# Patient Record
Sex: Female | Born: 1965 | Race: Black or African American | Hispanic: No | Marital: Single | Smoking: Former smoker
Health system: Southern US, Community
[De-identification: ages and names within clinical notes are randomized; demographics above are authoritative.]

## PROBLEM LIST (undated history)

## (undated) DIAGNOSIS — K219 Gastro-esophageal reflux disease without esophagitis: Secondary | ICD-10-CM

## (undated) DIAGNOSIS — M1990 Unspecified osteoarthritis, unspecified site: Secondary | ICD-10-CM

## (undated) DIAGNOSIS — IMO0001 Reserved for inherently not codable concepts without codable children: Secondary | ICD-10-CM

## (undated) DIAGNOSIS — R7301 Impaired fasting glucose: Secondary | ICD-10-CM

## (undated) DIAGNOSIS — Z9889 Other specified postprocedural states: Secondary | ICD-10-CM

## (undated) DIAGNOSIS — I10 Essential (primary) hypertension: Secondary | ICD-10-CM

## (undated) DIAGNOSIS — E669 Obesity, unspecified: Secondary | ICD-10-CM

## (undated) HISTORY — DX: Gastro-esophageal reflux disease without esophagitis: K21.9

## (undated) HISTORY — DX: Impaired fasting glucose: R73.01

## (undated) HISTORY — DX: Other specified postprocedural states: Z98.890

## (undated) HISTORY — DX: Obesity, unspecified: E66.9

## (undated) HISTORY — DX: Essential (primary) hypertension: I10

## (undated) HISTORY — DX: Reserved for inherently not codable concepts without codable children: IMO0001

## (undated) HISTORY — DX: Unspecified osteoarthritis, unspecified site: M19.90

---

## 2010-09-13 ENCOUNTER — Other Ambulatory Visit: Payer: Self-pay | Admitting: Family Medicine

## 2010-09-13 DIAGNOSIS — Z139 Encounter for screening, unspecified: Secondary | ICD-10-CM

## 2010-09-18 ENCOUNTER — Ambulatory Visit (HOSPITAL_COMMUNITY)
Admission: RE | Admit: 2010-09-18 | Discharge: 2010-09-18 | Disposition: A | Discharge status: Home / Self Care | Payer: 59 | Source: Ambulatory Visit | Attending: Family Medicine | Admitting: Family Medicine

## 2010-09-18 DIAGNOSIS — Z139 Encounter for screening, unspecified: Secondary | ICD-10-CM

## 2010-09-18 DIAGNOSIS — Z1231 Encounter for screening mammogram for malignant neoplasm of breast: Secondary | ICD-10-CM | POA: Insufficient documentation

## 2010-09-25 ENCOUNTER — Other Ambulatory Visit: Payer: Self-pay | Admitting: Family Medicine

## 2010-09-25 DIAGNOSIS — R928 Other abnormal and inconclusive findings on diagnostic imaging of breast: Secondary | ICD-10-CM

## 2010-10-03 ENCOUNTER — Ambulatory Visit (HOSPITAL_COMMUNITY)
Admission: RE | Admit: 2010-10-03 | Discharge: 2010-10-03 | Disposition: A | Discharge status: Home / Self Care | Payer: 59 | Source: Ambulatory Visit | Attending: Family Medicine | Admitting: Family Medicine

## 2010-10-03 ENCOUNTER — Other Ambulatory Visit (HOSPITAL_COMMUNITY): Payer: Self-pay | Admitting: Family Medicine

## 2010-10-03 ENCOUNTER — Other Ambulatory Visit: Payer: Self-pay | Admitting: Family Medicine

## 2010-10-03 DIAGNOSIS — R928 Other abnormal and inconclusive findings on diagnostic imaging of breast: Secondary | ICD-10-CM

## 2010-10-03 DIAGNOSIS — N6009 Solitary cyst of unspecified breast: Secondary | ICD-10-CM | POA: Insufficient documentation

## 2010-10-10 ENCOUNTER — Inpatient Hospital Stay (HOSPITAL_COMMUNITY): Admission: RE | Admit: 2010-10-10 | Payer: 59 | Source: Ambulatory Visit

## 2011-03-11 ENCOUNTER — Ambulatory Visit: Payer: 59 | Admitting: Sports Medicine

## 2012-06-24 ENCOUNTER — Encounter: Payer: Self-pay | Admitting: *Deleted

## 2012-07-07 ENCOUNTER — Telehealth: Payer: Self-pay | Admitting: Family Medicine

## 2012-07-07 ENCOUNTER — Other Ambulatory Visit: Payer: Self-pay | Admitting: *Deleted

## 2012-07-07 MED ORDER — VERAPAMIL HCL 180 MG (CO) PO TB24: 180.0000 mg | ORAL_TABLET | Freq: Every day | ORAL | Status: DC

## 2012-07-07 NOTE — Telephone Encounter (Signed)
Patient needs a refill of Verapamil to [facility] in [place]

## 2012-07-07 NOTE — Telephone Encounter (Signed)
Verapamil 180mg  #30 1 po qhs with 2 refills called into [facility] . Pt notifiied on voicemail.

## 2012-07-08 ENCOUNTER — Telehealth: Payer: Self-pay | Admitting: Family Medicine

## 2012-07-08 ENCOUNTER — Other Ambulatory Visit: Payer: Self-pay | Admitting: Nurse Practitioner

## 2012-07-08 MED ORDER — CYCLOBENZAPRINE HCL 10 MG PO TABS: 10.0000 mg | ORAL_TABLET | Freq: Three times a day (TID) | ORAL | Status: DC | PRN

## 2012-07-08 NOTE — Telephone Encounter (Signed)
Patient would like the flexeril called in to [facility] for her leg that Eber Jones was going to call in for her.

## 2012-07-10 ENCOUNTER — Emergency Department (HOSPITAL_COMMUNITY)
Admission: EM | Admit: 2012-07-10 | Discharge: 2012-07-10 | Disposition: A | Discharge status: Home / Self Care | Payer: 59 | Attending: Emergency Medicine | Admitting: Emergency Medicine

## 2012-07-10 ENCOUNTER — Encounter (HOSPITAL_COMMUNITY): Payer: Self-pay | Admitting: Emergency Medicine

## 2012-07-10 DIAGNOSIS — M25511 Pain in right shoulder: Secondary | ICD-10-CM

## 2012-07-10 DIAGNOSIS — M5432 Sciatica, left side: Secondary | ICD-10-CM

## 2012-07-10 DIAGNOSIS — M25519 Pain in unspecified shoulder: Secondary | ICD-10-CM | POA: Insufficient documentation

## 2012-07-10 DIAGNOSIS — I10 Essential (primary) hypertension: Secondary | ICD-10-CM | POA: Insufficient documentation

## 2012-07-10 DIAGNOSIS — K219 Gastro-esophageal reflux disease without esophagitis: Secondary | ICD-10-CM | POA: Insufficient documentation

## 2012-07-10 DIAGNOSIS — Z7982 Long term (current) use of aspirin: Secondary | ICD-10-CM | POA: Insufficient documentation

## 2012-07-10 DIAGNOSIS — Z8739 Personal history of other diseases of the musculoskeletal system and connective tissue: Secondary | ICD-10-CM | POA: Insufficient documentation

## 2012-07-10 DIAGNOSIS — M545 Low back pain, unspecified: Secondary | ICD-10-CM | POA: Insufficient documentation

## 2012-07-10 DIAGNOSIS — E669 Obesity, unspecified: Secondary | ICD-10-CM | POA: Insufficient documentation

## 2012-07-10 DIAGNOSIS — M5430 Sciatica, unspecified side: Secondary | ICD-10-CM | POA: Insufficient documentation

## 2012-07-10 DIAGNOSIS — Z79899 Other long term (current) drug therapy: Secondary | ICD-10-CM | POA: Insufficient documentation

## 2012-07-10 MED ORDER — PREDNISONE 10 MG PO TABS: ORAL_TABLET | ORAL | Status: DC

## 2012-07-10 MED ORDER — ONDANSETRON HCL 4 MG PO TABS
4.0000 mg | ORAL_TABLET | Freq: Once | ORAL | Status: AC
  Administered 2012-07-10: 4 mg via ORAL
  Filled 2012-07-10: qty 1

## 2012-07-10 MED ORDER — PREDNISONE 50 MG PO TABS
60.0000 mg | ORAL_TABLET | Freq: Once | ORAL | Status: AC
  Administered 2012-07-10: 60 mg via ORAL
  Filled 2012-07-10: qty 1

## 2012-07-10 MED ORDER — HYDROCODONE-ACETAMINOPHEN 5-325 MG PO TABS: 1.0000 | ORAL_TABLET | ORAL | Status: DC | PRN

## 2012-07-10 MED ORDER — KETOROLAC TROMETHAMINE 10 MG PO TABS
10.0000 mg | ORAL_TABLET | Freq: Once | ORAL | Status: AC
  Administered 2012-07-10: 10 mg via ORAL
  Filled 2012-07-10: qty 1

## 2012-07-10 NOTE — ED Notes (Signed)
Previous hx of bulging lumbar disc.  C/O pain at bottom of R L buttock radiating down L leg 6/10.  Pain at cervical spine radiating bilaterally over trapezius 6/10.  Has been using 800mg  Ibuprofen and Flexeril at home w/out relief.

## 2012-07-10 NOTE — ED Notes (Signed)
Pt complaining of left sciatica pain shooting down left leg for two weeks. Also complaining of left shoulder pain worse with lifting

## 2012-07-10 NOTE — ED Notes (Signed)
Patient with no complaints at this time. Respirations even and unlabored. Skin warm/dry. Discharge instructions reviewed with patient at this time. Patient given opportunity to voice concerns/ask questions. Patient discharged at this time and left Emergency Department with steady gait.   

## 2012-07-10 NOTE — ED Provider Notes (Signed)
History     CSN: [id number]  Arrival date & time 07/10/12  0940   First MD Initiated Contact with Patient 07/10/12 1046      Chief Complaint  Patient presents with  . Sciatica    (Consider location/radiation/quality/duration/timing/severity/associated sxs/prior treatment) Patient is a 47 y.o. female presenting with back pain. The history is provided by the patient.  Back Pain Location:  Lumbar spine Quality:  Aching and shooting Radiates to:  L thigh Pain severity:  Moderate Pain is:  Same all the time Onset quality:  Gradual Duration:  2 weeks Timing:  Intermittent Progression:  Worsening Chronicity:  Chronic Context comment:  Hx of sciatica Relieved by:  Nothing Worsened by:  Movement and standing Ineffective treatments:  NSAIDs and muscle relaxants Associated symptoms: no abdominal pain, no bladder incontinence, no bowel incontinence, no chest pain, no dysuria, no fever and no perianal numbness     Past Medical History  Diagnosis Date  . Hypertension   . Reflux   . IFG (impaired fasting glucose)   . Obesity   . Arthritis     History reviewed. No pertinent past surgical history.  Family History  Problem Relation Age of Onset  . Diabetes Mother     History  Substance Use Topics  . Smoking status: Not on file  . Smokeless tobacco: Not on file  . Alcohol Use: Not on file    OB History   Grav Para Term Preterm Abortions TAB SAB Ect Mult Living                  Review of Systems  Constitutional: Negative for fever and activity change.       All ROS Neg except as noted in HPI  HENT: Negative for nosebleeds and neck pain.   Eyes: Negative for photophobia and discharge.  Respiratory: Negative for cough, shortness of breath and wheezing.   Cardiovascular: Negative for chest pain and palpitations.  Gastrointestinal: Negative for abdominal pain, blood in stool and bowel incontinence.  Genitourinary: Negative for bladder incontinence, dysuria, frequency  and hematuria.  Musculoskeletal: Positive for back pain and arthralgias.  Skin: Negative.   Neurological: Negative for dizziness, seizures and speech difficulty.  Psychiatric/Behavioral: Negative for hallucinations and confusion.    Allergies  Lisinopril  Home Medications   Current Outpatient Rx  Name  Route  Sig  Dispense  Refill  . aspirin 81 MG tablet   Oral   Take 81 mg by mouth daily.         . cholecalciferol (VITAMIN D) 1000 UNITS tablet   Oral   Take 1,000 Units by mouth daily.         . cyclobenzaprine (FLEXERIL) 10 MG tablet   Oral   Take 1 tablet (10 mg total) by mouth 3 (three) times daily as needed for muscle spasms.   30 tablet   0   . hydrochlorothiazide (HYDRODIURIL) 25 MG tablet   Oral   Take 25 mg by mouth daily.         . pantoprazole (PROTONIX) 40 MG tablet   Oral   Take 40 mg by mouth daily.         . verapamil (COVERA HS) 180 MG (CO) 24 hr tablet   Oral   Take 1 tablet (180 mg total) by mouth at bedtime.   30 tablet   2     BP 149/88  Pulse 82  Temp(Src) 97.9 F (36.6 C) (Oral)  Resp 21  SpO2 97%  LMP 05/20/2012  Physical Exam  Nursing note and vitals reviewed. Constitutional: She is oriented to person, place, and time. She appears well-developed and well-nourished.  Non-toxic appearance.  HENT:  Head: Normocephalic.  Right Ear: Tympanic membrane and external ear normal.  Left Ear: Tympanic membrane and external ear normal.  Eyes: EOM and lids are normal. Pupils are equal, round, and reactive to light.  Neck: Normal range of motion. Neck supple. Carotid bruit is not present.  Cardiovascular: Normal rate, regular rhythm, normal heart sounds, intact distal pulses and normal pulses.   Pulmonary/Chest: Breath sounds normal. No respiratory distress.  Abdominal: Soft. Bowel sounds are normal. There is no tenderness. There is no guarding.  Musculoskeletal: Normal range of motion.  Is pain with attempted range of motion of the  right and left shoulders. This pain extends from the shoulder to the upper trapezius on the right and left. There is no deformity of either right or left shoulder. There is mild crepitus present.  There is pain to palpation of the lower lumbar area. There is paraspinal area tenderness in the lumbar region. There no hot areas appreciated. No palpable deformity. There is pain with straight leg raise at 40 on the left.  Lymphadenopathy:       Head (right side): No submandibular adenopathy present.       Head (left side): No submandibular adenopathy present.    She has no cervical adenopathy.  Neurological: She is alert and oriented to person, place, and time. She has normal strength. No cranial nerve deficit or sensory deficit.  Skin: Skin is warm and dry.  Psychiatric: She has a normal mood and affect. Her speech is normal.    ED Course  Procedures (including critical care time)  Labs Reviewed - No data to display No results found.  Pulse oximetry 97% on room air. Within normal limits by my interpretation. No diagnosis found.    MDM  I have reviewed nursing notes, vital signs, and all appropriate lab and imaging results for this patient. Patient presents to the emergency department with pain related to left sciatica. The patient states her primary physician treated her with ibuprofen and Flexeril. She states that these medications are not helping, and her pain seems to be getting worse. There's been no loss of bowel or bladder function. There's been no fall as a result of this back pain. The patient also complains of pain in both right and left shoulders with the left being worse than the right. She is had no injury or accident or surgery in this area.  No gross neurologic deficits appreciated. No deformity appreciated on examination. Suspect the patient is having an acute on chronic exacerbation of sciatica and degenerative joint disease involving the shoulders.  The plan at this time is  for the patient to continue the Flexeril and the Mobic that she has been ordered. The prescription has been given for Norco 5 mg #15 tablets and redness on taper #21 tablets. Patient advised to see her primary physician as sone as possible next week for followup and recheck.       Kathie Dike, PA-C 07/10/12 1124

## 2012-07-13 NOTE — ED Provider Notes (Signed)
Medical screening examination/treatment/procedure(s) were performed by non-physician practitioner and as supervising physician I was immediately available for consultation/collaboration.   Dione Booze, MD 07/13/12 2206

## 2012-07-26 ENCOUNTER — Encounter (HOSPITAL_COMMUNITY): Payer: Self-pay | Admitting: Emergency Medicine

## 2012-07-26 ENCOUNTER — Emergency Department (HOSPITAL_COMMUNITY)
Admission: EM | Admit: 2012-07-26 | Discharge: 2012-07-26 | Disposition: A | Discharge status: Home / Self Care | Payer: 59 | Attending: Emergency Medicine | Admitting: Emergency Medicine

## 2012-07-26 DIAGNOSIS — Z7982 Long term (current) use of aspirin: Secondary | ICD-10-CM | POA: Insufficient documentation

## 2012-07-26 DIAGNOSIS — K219 Gastro-esophageal reflux disease without esophagitis: Secondary | ICD-10-CM | POA: Insufficient documentation

## 2012-07-26 DIAGNOSIS — Z8639 Personal history of other endocrine, nutritional and metabolic disease: Secondary | ICD-10-CM | POA: Insufficient documentation

## 2012-07-26 DIAGNOSIS — M2550 Pain in unspecified joint: Secondary | ICD-10-CM | POA: Insufficient documentation

## 2012-07-26 DIAGNOSIS — Z862 Personal history of diseases of the blood and blood-forming organs and certain disorders involving the immune mechanism: Secondary | ICD-10-CM | POA: Insufficient documentation

## 2012-07-26 DIAGNOSIS — L03221 Cellulitis of neck: Secondary | ICD-10-CM

## 2012-07-26 DIAGNOSIS — I10 Essential (primary) hypertension: Secondary | ICD-10-CM | POA: Insufficient documentation

## 2012-07-26 DIAGNOSIS — L0211 Cutaneous abscess of neck: Secondary | ICD-10-CM | POA: Insufficient documentation

## 2012-07-26 DIAGNOSIS — Z8739 Personal history of other diseases of the musculoskeletal system and connective tissue: Secondary | ICD-10-CM | POA: Insufficient documentation

## 2012-07-26 DIAGNOSIS — Z79899 Other long term (current) drug therapy: Secondary | ICD-10-CM | POA: Insufficient documentation

## 2012-07-26 MED ORDER — HYDROXYZINE HCL 25 MG PO TABS: ORAL_TABLET | ORAL | Status: DC

## 2012-07-26 MED ORDER — AMOXICILLIN 500 MG PO CAPS: 500.0000 mg | ORAL_CAPSULE | Freq: Three times a day (TID) | ORAL | Status: DC

## 2012-07-26 MED ORDER — LIDOCAINE HCL (PF) 1 % IJ SOLN
INTRAMUSCULAR | Status: AC
  Administered 2012-07-26: 1.2 mL
  Filled 2012-07-26: qty 5

## 2012-07-26 MED ORDER — DOXYCYCLINE HYCLATE 100 MG PO TABS
100.0000 mg | ORAL_TABLET | Freq: Once | ORAL | Status: AC
  Administered 2012-07-26: 100 mg via ORAL
  Filled 2012-07-26: qty 1

## 2012-07-26 MED ORDER — ONDANSETRON HCL 4 MG PO TABS
4.0000 mg | ORAL_TABLET | Freq: Once | ORAL | Status: AC
  Administered 2012-07-26: 4 mg via ORAL
  Filled 2012-07-26: qty 1

## 2012-07-26 MED ORDER — CEFTRIAXONE SODIUM 1 G IJ SOLR
1.0000 g | Freq: Once | INTRAMUSCULAR | Status: AC
  Administered 2012-07-26: 1 g via INTRAMUSCULAR
  Filled 2012-07-26: qty 10

## 2012-07-26 MED ORDER — DOXYCYCLINE HYCLATE 100 MG PO CAPS: 100.0000 mg | ORAL_CAPSULE | Freq: Two times a day (BID) | ORAL | Status: AC

## 2012-07-26 MED ORDER — FLUCONAZOLE 150 MG PO TABS: ORAL_TABLET | ORAL | Status: DC

## 2012-07-26 NOTE — ED Provider Notes (Signed)
History     CSN: [id number]  Arrival date & time 07/26/12  0920   First MD Initiated Contact with Patient 07/26/12 [phone]      Chief Complaint  Patient presents with  . Neck Pain  . Abscess    (Consider location/radiation/quality/duration/timing/severity/associated sxs/prior treatment) Patient is a 47 y.o. female presenting with abscess. The history is provided by the patient.  Abscess Location:  Head/neck Head/neck abscess location: posterior neck. Abscess quality: itching, redness and warmth   Abscess quality: not draining   Red streaking: no   Duration:  1 day Progression:  Worsening Chronicity:  New Context: not diabetes, not immunosuppression and not insect bite/sting   Relieved by:  Nothing Worsened by:  Nothing tried Ineffective treatments:  None tried Associated symptoms: no fever, no headaches, no nausea and no vomiting   Risk factors: prior abscess     Past Medical History  Diagnosis Date  . Hypertension   . Reflux   . IFG (impaired fasting glucose)   . Obesity   . Arthritis     No past surgical history on file.  Family History  Problem Relation Age of Onset  . Diabetes Mother     History  Substance Use Topics  . Smoking status: Not on file  . Smokeless tobacco: Not on file  . Alcohol Use: Not on file    OB History   Grav Para Term Preterm Abortions TAB SAB Ect Mult Living                  Review of Systems  Constitutional: Negative for fever and activity change.       All ROS Neg except as noted in HPI  HENT: Negative for nosebleeds and neck pain.   Eyes: Negative for photophobia and discharge.  Respiratory: Negative for cough, shortness of breath and wheezing.   Cardiovascular: Negative for chest pain and palpitations.  Gastrointestinal: Negative for nausea, vomiting, abdominal pain and blood in stool.  Genitourinary: Negative for dysuria, frequency and hematuria.  Musculoskeletal: Positive for arthralgias. Negative for back pain.    Skin: Negative.        abscess  Neurological: Negative for dizziness, seizures, speech difficulty and headaches.  Psychiatric/Behavioral: Negative for hallucinations and confusion.    Allergies  Lisinopril  Home Medications   Current Outpatient Rx  Name  Route  Sig  Dispense  Refill  . aspirin 81 MG tablet   Oral   Take 81 mg by mouth daily.         . cholecalciferol (VITAMIN D) 1000 UNITS tablet   Oral   Take 1,000 Units by mouth daily.         . cyclobenzaprine (FLEXERIL) 10 MG tablet   Oral   Take 1 tablet (10 mg total) by mouth 3 (three) times daily as needed for muscle spasms.   30 tablet   0   . hydrochlorothiazide (HYDRODIURIL) 25 MG tablet   Oral   Take 25 mg by mouth daily.         Marland Kitchen HYDROcodone-acetaminophen (NORCO/VICODIN) 5-325 MG per tablet   Oral   Take 1 tablet by mouth every 4 (four) hours as needed for pain.   15 tablet   0   . pantoprazole (PROTONIX) 40 MG tablet   Oral   Take 40 mg by mouth daily.         . predniSONE (DELTASONE) 10 MG tablet      6,5,4,3,2,1 - take with food   21  tablet   0   . verapamil (COVERA HS) 180 MG (CO) 24 hr tablet   Oral   Take 1 tablet (180 mg total) by mouth at bedtime.   30 tablet   2     BP 138/91  Pulse 79  Temp(Src) 97.5 F (36.4 C) (Oral)  Resp 20  Ht 6' (1.829 m)  Wt 325 lb (147.419 kg)  BMI 44.07 kg/m2  SpO2 100%  LMP 06/10/2012  Physical Exam  Nursing note and vitals reviewed. Constitutional: She is oriented to person, place, and time. She appears well-developed and well-nourished.  Non-toxic appearance.  HENT:  Head: Normocephalic.  Right Ear: Tympanic membrane and external ear normal.  Left Ear: Tympanic membrane and external ear normal.  Eyes: EOM and lids are normal. Pupils are equal, round, and reactive to light.  Neck: Normal range of motion. Neck supple. Carotid bruit is not present.    Large raised , red, warm area of the posterior neck and upper back. No drainage or  red streaking.  Cardiovascular: Normal rate, regular rhythm, normal heart sounds, intact distal pulses and normal pulses.   Pulmonary/Chest: Breath sounds normal. No respiratory distress.  Abdominal: Soft. Bowel sounds are normal. There is no tenderness. There is no guarding.  Musculoskeletal: Normal range of motion.  Lymphadenopathy:       Head (right side): No submandibular adenopathy present.       Head (left side): No submandibular adenopathy present.    She has no cervical adenopathy.  Neurological: She is alert and oriented to person, place, and time. She has normal strength. No cranial nerve deficit or sensory deficit.  Skin: Skin is warm and dry.  Psychiatric: She has a normal mood and affect. Her speech is normal.    ED Course  Procedures (including critical care time)  Labs Reviewed - No data to display No results found.   No diagnosis found.    MDM  I have reviewed nursing notes, vital signs, and all appropriate lab and imaging results for this patient. Pt seen with me by Dr Manus Gunning. Bedside ultrasound performed. No drainable abscess noted. Pt will be treated for cellulitis with amoxil and doxycycline. Atarax for itching at hs. Pt to see her PCP on Tuesday or return to the ED.       Kathie Dike, PA-C 07/26/12 1015

## 2012-07-26 NOTE — ED Provider Notes (Signed)
Medical screening examination/treatment/procedure(s) were conducted as a shared visit with non-physician practitioner(s) and myself.  I personally evaluated the patient during the encounter  Cellulitis to posterior neck. No fluid collection on bedside US. Antibiotics and recheck. No history of DM.  Glynn Octave, MD 07/26/12 [phone]

## 2012-07-26 NOTE — ED Notes (Signed)
Patient with no complaints at this time. Respirations even and unlabored. Skin warm/dry. Discharge instructions reviewed with patient at this time. Patient given opportunity to voice concerns/ask questions. Patient discharged at this time and left Emergency Department with steady gait.   

## 2012-07-26 NOTE — ED Notes (Signed)
The patient states that she woke up this morning with posterior neck pain and tenderness.  States that she has had an abscess in the area before approx 1 year ago.

## 2012-07-30 ENCOUNTER — Encounter: Payer: Self-pay | Admitting: Nurse Practitioner

## 2012-07-30 ENCOUNTER — Ambulatory Visit (INDEPENDENT_AMBULATORY_CARE_PROVIDER_SITE_OTHER): Payer: Managed Care, Other (non HMO) | Admitting: Nurse Practitioner

## 2012-07-30 VITALS — BP 128/80 | HR 70 | Ht 72.0 in

## 2012-07-30 DIAGNOSIS — L0291 Cutaneous abscess, unspecified: Secondary | ICD-10-CM

## 2012-07-30 DIAGNOSIS — M5432 Sciatica, left side: Secondary | ICD-10-CM

## 2012-07-30 DIAGNOSIS — M5430 Sciatica, unspecified side: Secondary | ICD-10-CM

## 2012-07-30 DIAGNOSIS — L0390 Cellulitis, unspecified: Secondary | ICD-10-CM

## 2012-07-30 NOTE — Progress Notes (Signed)
Subjective:  Presents for recheck after 2 different ER visits. The first was on 5/16 for sciatica. Much improved. Occurs off and on depending on movements or lifting. No weakness or numbness of the left leg. The second visit was on 6/1 for cellulits on the back of her neck which is much improved. Currently on amoxicillin and doxycycline. No fever.  Objective:   BP 128/80  Pulse 70  Ht 6' (1.829 m)  LMP 07/27/2012 NAD. Alert, oriented. Lungs clear. Heart regular rate rhythm. No erythema warmth or tenderness noted on the neck/upper back area. Gait normal limit.  Assessment:Sciatica of left side  Cellulitis  resolving  Plan: Complete antibiotics as directed. Continue Flexeril and Mobic as directed for back pain. Warning signs reviewed. Recheck if any further problems.

## 2012-09-07 ENCOUNTER — Other Ambulatory Visit: Payer: Self-pay

## 2012-09-07 ENCOUNTER — Telehealth: Payer: Self-pay | Admitting: Family Medicine

## 2012-09-07 MED ORDER — HYDROXYZINE HCL 25 MG PO TABS: ORAL_TABLET | ORAL | Status: DC

## 2012-09-07 NOTE — Telephone Encounter (Signed)
Rx refill hydroxyzine sent into [facility]. Patient was notified.

## 2012-09-07 NOTE — Telephone Encounter (Signed)
Ok with 2 ref 

## 2012-09-07 NOTE — Telephone Encounter (Signed)
Patient needs refiil of hydroxyzine to [facility]

## 2012-09-11 ENCOUNTER — Encounter: Payer: Self-pay | Admitting: Family Medicine

## 2012-09-11 ENCOUNTER — Telehealth: Payer: Self-pay | Admitting: Family Medicine

## 2012-09-11 ENCOUNTER — Encounter: Payer: Self-pay | Admitting: Nurse Practitioner

## 2012-09-11 ENCOUNTER — Ambulatory Visit (INDEPENDENT_AMBULATORY_CARE_PROVIDER_SITE_OTHER): Payer: Managed Care, Other (non HMO) | Admitting: Nurse Practitioner

## 2012-09-11 VITALS — BP 126/78 | HR 70 | Wt 319.8 lb

## 2012-09-11 DIAGNOSIS — M5430 Sciatica, unspecified side: Secondary | ICD-10-CM

## 2012-09-11 DIAGNOSIS — M5431 Sciatica, right side: Secondary | ICD-10-CM

## 2012-09-11 MED ORDER — NAPROXEN 500 MG PO TABS: 500.0000 mg | ORAL_TABLET | Freq: Two times a day (BID) | ORAL | Status: DC

## 2012-09-11 MED ORDER — PANTOPRAZOLE SODIUM 40 MG PO TBEC: 40.0000 mg | DELAYED_RELEASE_TABLET | Freq: Every day | ORAL | Status: DC

## 2012-09-11 MED ORDER — HYDROCODONE-ACETAMINOPHEN 5-325 MG PO TABS: 1.0000 | ORAL_TABLET | ORAL | Status: DC | PRN

## 2012-09-11 NOTE — Telephone Encounter (Signed)
Patient needs [facility] paperwork for her physical at the end of August

## 2012-09-11 NOTE — Progress Notes (Signed)
Subjective:  Presents complaints of localized right low back pain for the past month. Was seen for left low back pain previously, this has resolved. Does not radiate. No numbness weakness or pain in the right leg. 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 Slight relief with OTC Aleve and ice applications. No relief with muscle relaxants. Did not get previous hydrocodone prescription filled for 15 pills on 5/16.  Objective:   BP 126/78  Pulse 70  Wt 319 lb 12.8 oz (145.06 kg)  BMI 43.36 kg/m2 NAD. Alert, oriented. No spinal tenderness. Distinct tenderness noted in the right mid buttock near the sciatic notch. SLR negative bilaterally reflexes normal limit. Gait slow but steady.  Assessment:Sciatica, right  Plan: Meds ordered this encounter  Medications  . naproxen (NAPROSYN) 500 MG tablet    Sig: Take 1 tablet (500 mg total) by mouth 2 (two) times daily with a meal. Prn pain    Dispense:  30 tablet    Refill:  0    Order Specific Question:  Supervising Provider    Answer:  Merlyn Albert [[id number]]  . HYDROcodone-acetaminophen (NORCO/VICODIN) 5-325 MG per tablet    Sig: Take 1 tablet by mouth every 4 (four) hours as needed for pain.    Dispense:  15 tablet    Refill:  0    Order Specific Question:  Supervising Provider    Answer:  Preston Fleeting, DAVID [[id number]]  . pantoprazole (PROTONIX) 40 MG tablet    Sig: Take 1 tablet (40 mg total) by mouth daily.    Dispense:  30 tablet    Refill:  5    Order Specific Question:  Supervising Provider    Answer:  Merlyn Albert [[id number]]   At end of visit patient requested a refill on her Protonix. DC naproxen if any abdominal pain or increased reflux. Drowsiness precautions for Vicodin. Ice/heat applications. Weight loss. Restart back exercises. Call back if worsens or persists.

## 2012-09-14 ENCOUNTER — Other Ambulatory Visit: Payer: Self-pay | Admitting: Nurse Practitioner

## 2012-09-14 DIAGNOSIS — R7301 Impaired fasting glucose: Secondary | ICD-10-CM

## 2012-09-14 DIAGNOSIS — E559 Vitamin D deficiency, unspecified: Secondary | ICD-10-CM

## 2012-09-14 DIAGNOSIS — Z13220 Encounter for screening for lipoid disorders: Secondary | ICD-10-CM

## 2012-09-14 DIAGNOSIS — Z0000 Encounter for general adult medical examination without abnormal findings: Secondary | ICD-10-CM

## 2012-09-14 DIAGNOSIS — D649 Anemia, unspecified: Secondary | ICD-10-CM

## 2012-09-14 DIAGNOSIS — I10 Essential (primary) hypertension: Secondary | ICD-10-CM

## 2012-09-14 NOTE — Telephone Encounter (Signed)
Left message on voicemail notifying patient blood work papers are ready for pickup. 

## 2012-09-14 NOTE — Telephone Encounter (Signed)
See printed lab report

## 2012-09-25 ENCOUNTER — Other Ambulatory Visit: Payer: Self-pay | Admitting: Nurse Practitioner

## 2012-10-22 ENCOUNTER — Encounter: Payer: Managed Care, Other (non HMO) | Admitting: Nurse Practitioner

## 2012-11-09 ENCOUNTER — Emergency Department (HOSPITAL_COMMUNITY): Payer: 59

## 2012-11-09 ENCOUNTER — Emergency Department (HOSPITAL_COMMUNITY)
Admission: EM | Admit: 2012-11-09 | Discharge: 2012-11-09 | Disposition: A | Discharge status: Home / Self Care | Payer: 59 | Attending: Emergency Medicine | Admitting: Emergency Medicine

## 2012-11-09 ENCOUNTER — Encounter (HOSPITAL_COMMUNITY): Payer: Self-pay | Admitting: *Deleted

## 2012-11-09 DIAGNOSIS — M25562 Pain in left knee: Secondary | ICD-10-CM

## 2012-11-09 DIAGNOSIS — M1710 Unilateral primary osteoarthritis, unspecified knee: Secondary | ICD-10-CM | POA: Insufficient documentation

## 2012-11-09 DIAGNOSIS — Z79899 Other long term (current) drug therapy: Secondary | ICD-10-CM | POA: Insufficient documentation

## 2012-11-09 DIAGNOSIS — Z87891 Personal history of nicotine dependence: Secondary | ICD-10-CM | POA: Insufficient documentation

## 2012-11-09 DIAGNOSIS — K219 Gastro-esophageal reflux disease without esophagitis: Secondary | ICD-10-CM | POA: Insufficient documentation

## 2012-11-09 DIAGNOSIS — Z7982 Long term (current) use of aspirin: Secondary | ICD-10-CM | POA: Insufficient documentation

## 2012-11-09 DIAGNOSIS — I10 Essential (primary) hypertension: Secondary | ICD-10-CM | POA: Insufficient documentation

## 2012-11-09 DIAGNOSIS — E669 Obesity, unspecified: Secondary | ICD-10-CM | POA: Insufficient documentation

## 2012-11-09 DIAGNOSIS — M25569 Pain in unspecified knee: Secondary | ICD-10-CM | POA: Insufficient documentation

## 2012-11-09 DIAGNOSIS — M1990 Unspecified osteoarthritis, unspecified site: Secondary | ICD-10-CM

## 2012-11-09 MED ORDER — OXYCODONE-ACETAMINOPHEN 5-325 MG PO TABS
1.0000 | ORAL_TABLET | Freq: Once | ORAL | Status: AC
  Administered 2012-11-09: 1 via ORAL
  Filled 2012-11-09: qty 1

## 2012-11-09 MED ORDER — OXYCODONE-ACETAMINOPHEN 5-325 MG PO TABS: 1.0000 | ORAL_TABLET | ORAL | Status: DC | PRN

## 2012-11-09 NOTE — ED Notes (Signed)
PA at bedside.

## 2012-11-09 NOTE — ED Notes (Signed)
Pt states she took some ibuprofen this morning. 400mg  at 1200

## 2012-11-09 NOTE — ED Notes (Signed)
C/o left knee pain that started 11/01/2012, she thinks it began when she wore high heels to church.  Knee does not appear to be swollen, not painful to the touch.  Pt states she cannot bend and it hurts when she walks.

## 2012-11-09 NOTE — ED Notes (Signed)
Lt knee pain , swelling for 1 week,  No injury.

## 2012-11-09 NOTE — ED Provider Notes (Signed)
CSN: [id number]     Arrival date & time 11/09/12  1422 History   First MD Initiated Contact with Patient 11/09/12 1505     Chief Complaint  Patient presents with  . Knee Pain   (Consider location/radiation/quality/duration/timing/severity/associated sxs/prior Treatment) HPI Comments: Stormee Duda is a 47 y.o. female who presents to the Emergency Department complaining of left knee pain that has been increasing in severity for one week.  Pain increased after wearing "high heels" to church.  States she feels crunching sensation" to her knee with weight bearing and pain worsens with flexion of the knee.  She denies known injury, erythema, excessive warmth, or pain distal to her knee.    Patient is a 47 y.o. female presenting with knee pain. The history is provided by the patient.  Knee Pain Location:  Knee Time since incident:  1 week Injury: no   Knee location:  L knee Pain details:    Quality:  Aching and throbbing   Radiates to:  Does not radiate   Severity:  Moderate   Onset quality:  Gradual   Timing:  Constant   Progression:  Worsening Chronicity:  New Dislocation: no   Foreign body present:  No foreign bodies Prior injury to area:  No Relieved by:  Rest Worsened by:  Activity, bearing weight and flexion Ineffective treatments:  NSAIDs Associated symptoms: swelling   Associated symptoms: no back pain, no decreased ROM, no fever, no itching, no neck pain, no numbness and no tingling     Past Medical History  Diagnosis Date  . Hypertension   . Reflux   . IFG (impaired fasting glucose)   . Obesity   . Arthritis    History reviewed. No pertinent past surgical history. Family History  Problem Relation Age of Onset  . Diabetes Mother    History  Substance Use Topics  . Smoking status: Former Games developer  . Smokeless tobacco: Not on file  . Alcohol Use: No   OB History   Grav Para Term Preterm Abortions TAB SAB Ect Mult Living                 Review of Systems    Constitutional: Negative for fever and chills.  HENT: Negative for neck pain.   Genitourinary: Negative for dysuria and difficulty urinating.  Musculoskeletal: Positive for joint swelling and arthralgias. Negative for back pain.  Skin: Negative for color change, itching and wound.  All other systems reviewed and are negative.    Allergies  Lisinopril  Home Medications   Current Outpatient Rx  Name  Route  Sig  Dispense  Refill  . aspirin EC 81 MG tablet   Oral   Take 81 mg by mouth daily.         . hydrochlorothiazide (HYDRODIURIL) 25 MG tablet   Oral   Take 25 mg by mouth daily.         Marland Kitchen ibuprofen (ADVIL,MOTRIN) 200 MG tablet   Oral   Take 200 mg by mouth every 6 (six) hours as needed for pain.         . meloxicam (MOBIC) 15 MG tablet   Oral   Take 15 mg by mouth daily.         . naproxen (NAPROSYN) 500 MG tablet   Oral   Take 1 tablet (500 mg total) by mouth 2 (two) times daily with a meal. Prn pain   30 tablet   0   . pantoprazole (PROTONIX) 40 MG tablet  Oral   Take 1 tablet (40 mg total) by mouth daily.   30 tablet   5    BP 128/67  Pulse 85  Temp(Src) 98 F (36.7 C) (Oral)  Resp 18  Ht 6' (1.829 m)  Wt 312 lb (141.522 kg)  BMI 42.31 kg/m2  SpO2 99%  LMP 11/09/2012 Physical Exam  Nursing note and vitals reviewed. Constitutional: She is oriented to person, place, and time. She appears well-developed and well-nourished. No distress.  Patient is obese  Cardiovascular: Normal rate, regular rhythm, normal heart sounds and intact distal pulses.   No murmur heard. Pulmonary/Chest: Effort normal and breath sounds normal. No respiratory distress.  Musculoskeletal: She exhibits tenderness. She exhibits no edema.       Left knee: She exhibits normal range of motion, no swelling, no effusion, no ecchymosis, no deformity, no laceration, no erythema and normal patellar mobility. Tenderness found. Medial joint line and patellar tendon tenderness noted.        Legs: ttp of the anteromedial left knee.  No erythema, effusion, excessive warmth or step-off deformity.  DP pulse brisk, distal sensation intact. Calf is soft and NT.  Mild patella crepitus.  Patella tendon appears intact  Neurological: She is alert and oriented to person, place, and time. She exhibits normal muscle tone. Coordination normal.  Skin: Skin is warm and dry. No erythema.    ED Course  Procedures (including critical care time) Labs Review Labs Reviewed - No data to display Imaging Review Dg Knee Complete 4 Views Left  11/09/2012   *RADIOLOGY REPORT*  Clinical Data: Knee pain  LEFT KNEE - COMPLETE 4+ VIEW  Comparison: None.  Findings: Medial compartment joint space narrowing, mild sclerosis and bony spurring compatible with osteoarthritis.  Similar mild arthritic changes of the patellofemoral compartment.  Lateral compartment is spared.  Normal alignment without fracture.  Small to moderate effusion on the lateral view.  IMPRESSION: Medial and patellofemoral osteoarthritis.  Joint effusion.  No acute fracture.   Original Report Authenticated By: Judie Petit. Miles Costain, M.D.    MDM     X-ray findings discussed with patient.  Clinical suspicion for septic joint is low.    Knee immobilizer applied for comfort, pain improved,  Remains NV intact.  Pt agrees to close f/u with orthopedics, referral info given.  Pt also agrees to RICE therapy and weight loss also discussed.  Patient appears stable for discharge.    Arrick Dutton L. Trisha Mangle, PA-C 11/09/12 2105

## 2012-11-10 NOTE — ED Provider Notes (Signed)
Medical screening examination/treatment/procedure(s) were performed by non-physician practitioner and as supervising physician I was immediately available for consultation/collaboration.  Lazara Grieser, MD 11/10/12 1819 

## 2012-11-11 ENCOUNTER — Encounter: Payer: Self-pay | Admitting: Nurse Practitioner

## 2012-11-12 ENCOUNTER — Telehealth: Payer: Self-pay | Admitting: Family Medicine

## 2012-11-12 MED ORDER — HYDROCHLOROTHIAZIDE 25 MG PO TABS: 25.0000 mg | ORAL_TABLET | Freq: Every day | ORAL | Status: DC

## 2012-11-12 NOTE — Telephone Encounter (Signed)
Patient needs refill on HCTZ 25mg .  Please send in to [facility] and call patient when done

## 2012-11-12 NOTE — Telephone Encounter (Signed)
Refill sent electronically to [facility]. Patient notified.

## 2012-11-26 ENCOUNTER — Ambulatory Visit (INDEPENDENT_AMBULATORY_CARE_PROVIDER_SITE_OTHER): Payer: 59 | Admitting: Nurse Practitioner

## 2012-11-26 ENCOUNTER — Encounter: Payer: Self-pay | Admitting: Nurse Practitioner

## 2012-11-26 VITALS — BP 132/90 | Ht 72.0 in | Wt 315.6 lb

## 2012-11-26 DIAGNOSIS — Z01419 Encounter for gynecological examination (general) (routine) without abnormal findings: Secondary | ICD-10-CM

## 2012-11-26 DIAGNOSIS — Z0000 Encounter for general adult medical examination without abnormal findings: Secondary | ICD-10-CM

## 2012-11-27 ENCOUNTER — Encounter: Payer: Self-pay | Admitting: Nurse Practitioner

## 2012-11-27 NOTE — Progress Notes (Signed)
  Subjective:    Patient ID: Kelly Ramos, female    DOB: August 07, 1965, 47 y.o.   MRN: [id number]  HPI presents for wellness checkup. Having regular cycles, heavy flow for the first 2 days but normal. Is not sexually active. No new sexual partner since her last physical. No pelvic pain. No genital sores or lesions. Has severe left knee pain. Has had an injection done. Has appointment with Dr. Thurston Hole this afternoon. Unable to get into stirrups today. Some difficulty getting onto the table. Defers pelvic exam today. Gets regular dental and visual exams. Reflux stable on Protonix. Gets her mammograms and lab work at her job. Last mammogram was in April. Flu vaccine available through work.    Review of Systems  Constitutional: Positive for activity change. Negative for fever, appetite change and fatigue.  HENT: Negative for hearing loss, ear pain, sore throat, rhinorrhea, dental problem and postnasal drip.   Eyes: Negative for visual disturbance.  Respiratory: Negative for cough, chest tightness, shortness of breath and wheezing.   Cardiovascular: Negative for chest pain.  Gastrointestinal: Negative for nausea, vomiting, abdominal pain, diarrhea, constipation and blood in stool.  Genitourinary: Negative for dysuria, urgency, frequency, vaginal discharge, difficulty urinating, genital sores, menstrual problem and pelvic pain.  Musculoskeletal: Positive for joint swelling, arthralgias and gait problem.  Psychiatric/Behavioral: Negative for sleep disturbance and dysphoric mood. The patient is not nervous/anxious.        Objective:   Physical Exam  Vitals reviewed. Constitutional: She is oriented to person, place, and time. She appears well-developed. No distress.  HENT:  Right Ear: External ear normal.  Left Ear: External ear normal.  Mouth/Throat: Oropharynx is clear and moist.  Neck: Normal range of motion. Neck supple. No tracheal deviation present. No thyromegaly present.  Cardiovascular:  Normal rate, regular rhythm and normal heart sounds.  Exam reveals no gallop.   No murmur heard. Pulmonary/Chest: Effort normal and breath sounds normal.  Abdominal: Soft. She exhibits no distension. There is no tenderness.  Genitourinary: Vagina normal and uterus normal. No vaginal discharge found.  Musculoskeletal: She exhibits no edema.  Lymphadenopathy:    She has no cervical adenopathy.  Neurological: She is alert and oriented to person, place, and time.  Skin: Skin is warm and dry. No rash noted.  Psychiatric: She has a normal mood and affect. Her behavior is normal.   Breast exam: Large breasts with no obvious masses; axilla no adenopathy. GU and pelvic exam deferred by patient.        Assessment & Plan:  Well woman exam  Activity as tolerated. Healthy diet. Vitamin D and calcium supplementation. Followup with orthopedic specialist as planned. Request the patient send [place] copies of her mammograms and lab work done at her job. Next physical in one year.

## 2012-12-02 ENCOUNTER — Telehealth: Payer: Self-pay | Admitting: Family Medicine

## 2012-12-02 NOTE — Telephone Encounter (Signed)
Pt went to see Dr Thurston Hole in [facility] but she did not like his results. Can she get a referral to another Ortho Doc?

## 2012-12-06 NOTE — Telephone Encounter (Signed)
Okay let's

## 2012-12-07 ENCOUNTER — Other Ambulatory Visit (HOSPITAL_COMMUNITY): Payer: Self-pay | Admitting: Orthopedic Surgery

## 2012-12-07 ENCOUNTER — Other Ambulatory Visit: Payer: Self-pay

## 2012-12-07 DIAGNOSIS — M25561 Pain in right knee: Secondary | ICD-10-CM

## 2012-12-07 DIAGNOSIS — M25562 Pain in left knee: Secondary | ICD-10-CM

## 2012-12-07 NOTE — Telephone Encounter (Signed)
Referral processed in the system. Left message on voicemail notifying patient.

## 2012-12-10 ENCOUNTER — Telehealth: Payer: Self-pay | Admitting: Nurse Practitioner

## 2012-12-10 ENCOUNTER — Other Ambulatory Visit: Payer: Self-pay | Admitting: Nurse Practitioner

## 2012-12-10 MED ORDER — HYDROCODONE-ACETAMINOPHEN 5-325 MG PO TABS: 1.0000 | ORAL_TABLET | ORAL | Status: DC | PRN

## 2012-12-10 NOTE — Telephone Encounter (Signed)
Patient wants pain medication called in for left knee pain   [facility]

## 2012-12-10 NOTE — Telephone Encounter (Signed)
Will print an Rx for hydrocodone and leave at front desk. DEA now requires a written Rx for med.

## 2012-12-10 NOTE — Telephone Encounter (Signed)
Patient states that she does see Dr. Thurston Hole but he will not prescribe her anything. She states that she prefers to take hydrocodone.

## 2012-12-10 NOTE — Telephone Encounter (Signed)
Is she still seeing ortho specialist? Are they prescribing any pain med? Does she prefer oxycodone (last given at ER) or hydrocodone ( I gave it to her months ago for back pain)?

## 2012-12-11 NOTE — Telephone Encounter (Signed)
Patient notified that RX ready for pickup.

## 2012-12-16 ENCOUNTER — Ambulatory Visit (HOSPITAL_COMMUNITY): Payer: 59

## 2012-12-21 ENCOUNTER — Ambulatory Visit: Payer: 59 | Admitting: Cardiovascular Disease

## 2012-12-24 ENCOUNTER — Encounter: Payer: Self-pay | Admitting: Cardiovascular Disease

## 2012-12-24 ENCOUNTER — Ambulatory Visit (INDEPENDENT_AMBULATORY_CARE_PROVIDER_SITE_OTHER): Payer: 59 | Admitting: Cardiovascular Disease

## 2012-12-24 VITALS — BP 134/98 | HR 84 | Ht 72.0 in | Wt 311.7 lb

## 2012-12-24 DIAGNOSIS — Z9889 Other specified postprocedural states: Secondary | ICD-10-CM | POA: Insufficient documentation

## 2012-12-24 DIAGNOSIS — I10 Essential (primary) hypertension: Secondary | ICD-10-CM

## 2012-12-24 NOTE — Progress Notes (Signed)
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 Marius Ramos office work [place] for clearance prior to elective left total knee replacement. Her primary care physicians are [facility]. Her crit was 30 possibly positive for 2 hypertension. Her brother did have stents in his coronaries at age 39. She did denies history of heart attack or stroke. She she does not have chest pain or shortness of breath. She did have a cardiac catheterization performed again the Kelly Ramos November 2000 Kelly Ramos after an abnormal nuclear stress test because of chest pain suggesting a false positive test.   Current Outpatient Prescriptions  Medication Sig Dispense Refill  . aspirin EC 81 MG tablet Take 81 mg by mouth daily.      . hydrochlorothiazide (HYDRODIURIL) 25 MG tablet Take 1 tablet (25 mg total) by mouth daily.  30 tablet  1  . HYDROcodone-acetaminophen (NORCO/VICODIN) 5-325 MG per tablet Take 1 tablet by mouth every 4 (four) hours as needed for pain.  30 tablet  0  . meloxicam (MOBIC) 15 MG tablet Take 15 mg by mouth daily.      . naproxen (NAPROSYN) 500 MG tablet Take 1 tablet (500 mg total) by mouth 2 (two) times daily with a meal. Prn pain  30 tablet  0  . pantoprazole (PROTONIX) 40 MG tablet Take 1 tablet (40 mg total) by mouth daily.  30 tablet  5   No current facility-administered medications for this visit.    Allergies  Allergen Reactions  . Lisinopril Itching    History   Social History  . Marital Status: Single    Spouse Name: N/A    Number of Children: N/A  . Years of Education: N/A   Occupational History  . Not on file.   Social History Main Topics  . Smoking status: Former Smoker -- 15 years      Types: Cigarettes    Quit date: 12/24/1992  . Smokeless tobacco: Not on file  . Alcohol Use: No  . Drug Use: Yes     Comment: stopped 25 yrs ago  . Sexual Activity: Not Currently   Other Topics Concern  . Not on file   Social History Narrative  . No narrative on file     Review of Systems: General: negative for chills, fever, night sweats or weight changes.  Cardiovascular: negative for chest pain, dyspnea on exertion, edema, orthopnea, palpitations, paroxysmal nocturnal dyspnea or shortness of breath Dermatological: negative for rash Respiratory: negative for cough or wheezing Urologic: negative for hematuria Abdominal: negative for nausea, vomiting, diarrhea, bright red blood per rectum, melena, or hematemesis Neurologic: negative for visual changes, syncope, or dizziness All other systems reviewed and are otherwise negative except as noted above.    Blood pressure 134/98, pulse 84, height 6' (1.829 m), weight 311 lb 11.2 oz (141.386 kg).  General appearance: alert and no distress Neck: no adenopathy, no carotid bruit, no JVD, supple, symmetrical, trachea midline and thyroid not enlarged, symmetric, no tenderness/mass/nodules Lungs: clear to auscultation bilaterally Heart: regular rate and rhythm, S1, S2 normal, no murmur, click, rub or gallop Extremities: extremities normal, atraumatic, no cyanosis or edema Pulses: 2+ and symmetric  EKG normal sinus rhythm at 84 without ST or T wave changes  ASSESSMENT  AND PLAN:   Essential hypertension Blood pressure is elevated today because she did not take her blood pressure medicine. She does admit to dietary not compliance regarding salt  History of cardiac catheterization This was done after at a normal nuclear stress test because of chest pain. Her cardiac catheterization was abnormal suggesting a false positive study. The patient denies chest pain. She has no cardiac risk factors. She'll be cleared for her upcoming coming a  lot of totally replacement at low cardiovascular risk.      Kelly Gess MD FACP,FACC,FAHA, [facility] 12/24/2012 1:57 PM

## 2012-12-24 NOTE — Assessment & Plan Note (Signed)
This was done after at a normal nuclear stress test because of chest pain. Her cardiac catheterization was abnormal suggesting a false positive study. The patient denies chest pain. She has no cardiac risk factors. She'll be cleared for her upcoming coming a lot of totally replacement at low cardiovascular risk.

## 2012-12-24 NOTE — Assessment & Plan Note (Signed)
Blood pressure is elevated today because she did not take her blood pressure medicine. She does admit to dietary not compliance regarding salt

## 2012-12-24 NOTE — Patient Instructions (Signed)
Follow up with Dr Allyson Sabal only as needed.  We have cleared you for your surgery.

## 2012-12-25 ENCOUNTER — Encounter: Payer: Self-pay | Admitting: Cardiovascular Disease

## 2012-12-29 ENCOUNTER — Telehealth: Payer: Self-pay | Admitting: Nurse Practitioner

## 2012-12-29 ENCOUNTER — Other Ambulatory Visit: Payer: Self-pay | Admitting: *Deleted

## 2012-12-29 MED ORDER — HYDROCODONE-ACETAMINOPHEN 5-325 MG PO TABS: 1.0000 | ORAL_TABLET | ORAL | Status: DC | PRN

## 2012-12-29 NOTE — Telephone Encounter (Signed)
See other

## 2012-12-29 NOTE — Telephone Encounter (Signed)
When did we last rx? For what reason? How often takes? Carolyn's last note wellness and didn't mention

## 2012-12-29 NOTE — Telephone Encounter (Signed)
Patient needs Rx for hydrocodone. °

## 2012-12-29 NOTE — Telephone Encounter (Signed)
Last office visit 11-26-12

## 2012-12-29 NOTE — Telephone Encounter (Signed)
Ok times one, let her know ortho should write as long as under his care for knee

## 2012-12-29 NOTE — Telephone Encounter (Signed)
Patient last filled hydrocodone 12/19/12 #30 one every 6-8 hours by Dr. Wyline Mood. Having knee surgery on Friday. Kelly Ramos last wrote 10/16 #30. Pt states she takes one BID

## 2012-12-29 NOTE — Telephone Encounter (Signed)
Last filled 12/10/12 #30 one q 4 hours. Taking for knee pain has  torn cartilage. Surgery scheduled for Friday with Dr. Wyline Mood. Pt states she takes one BID.

## 2012-12-30 NOTE — Telephone Encounter (Signed)
Pt notified script is ready for pick up

## 2013-02-01 ENCOUNTER — Other Ambulatory Visit: Payer: Self-pay | Admitting: Family Medicine

## 2013-03-21 ENCOUNTER — Other Ambulatory Visit: Payer: Self-pay | Admitting: Family Medicine

## 2013-04-15 ENCOUNTER — Emergency Department (HOSPITAL_COMMUNITY)
Admission: EM | Admit: 2013-04-15 | Discharge: 2013-04-15 | Disposition: A | Discharge status: Home / Self Care | Payer: 59 | Attending: Emergency Medicine | Admitting: Emergency Medicine

## 2013-04-15 ENCOUNTER — Encounter (HOSPITAL_COMMUNITY): Payer: Self-pay | Admitting: Emergency Medicine

## 2013-04-15 ENCOUNTER — Emergency Department (HOSPITAL_COMMUNITY): Payer: 59

## 2013-04-15 DIAGNOSIS — R0789 Other chest pain: Secondary | ICD-10-CM | POA: Insufficient documentation

## 2013-04-15 DIAGNOSIS — I10 Essential (primary) hypertension: Secondary | ICD-10-CM | POA: Insufficient documentation

## 2013-04-15 DIAGNOSIS — Z79899 Other long term (current) drug therapy: Secondary | ICD-10-CM | POA: Insufficient documentation

## 2013-04-15 DIAGNOSIS — K219 Gastro-esophageal reflux disease without esophagitis: Secondary | ICD-10-CM | POA: Insufficient documentation

## 2013-04-15 DIAGNOSIS — M129 Arthropathy, unspecified: Secondary | ICD-10-CM | POA: Insufficient documentation

## 2013-04-15 DIAGNOSIS — Z9889 Other specified postprocedural states: Secondary | ICD-10-CM | POA: Insufficient documentation

## 2013-04-15 DIAGNOSIS — Z7982 Long term (current) use of aspirin: Secondary | ICD-10-CM | POA: Insufficient documentation

## 2013-04-15 DIAGNOSIS — Z87891 Personal history of nicotine dependence: Secondary | ICD-10-CM | POA: Insufficient documentation

## 2013-04-15 LAB — CBC WITH DIFFERENTIAL/PLATELET
Basophils Absolute: 0 10*3/uL (ref 0.0–0.1)
Basophils Relative: 0 % (ref 0–1)
Eosinophils Absolute: 0.1 10*3/uL (ref 0.0–0.7)
Eosinophils Relative: 1 % (ref 0–5)
HEMATOCRIT: 33.1 % — AB (ref 36.0–46.0)
Hemoglobin: 10.7 g/dL — ABNORMAL LOW (ref 12.0–15.0)
Lymphocytes Relative: 28 % (ref 12–46)
Lymphs Abs: 1.5 10*3/uL (ref 0.7–4.0)
MCH: 28.9 pg (ref 26.0–34.0)
MCHC: 32.3 g/dL (ref 30.0–36.0)
MCV: 89.5 fL (ref 78.0–100.0)
MONOS PCT: 8 % (ref 3–12)
Monocytes Absolute: 0.5 10*3/uL (ref 0.1–1.0)
NEUTROS ABS: 3.4 10*3/uL (ref 1.7–7.7)
Neutrophils Relative %: 63 % (ref 43–77)
Platelets: 274 10*3/uL (ref 150–400)
RBC: 3.7 MIL/uL — ABNORMAL LOW (ref 3.87–5.11)
RDW: 14.4 % (ref 11.5–15.5)
WBC: 5.4 10*3/uL (ref 4.0–10.5)

## 2013-04-15 LAB — TROPONIN I: Troponin I: <0.3 ng/mL (ref <0.30)

## 2013-04-15 LAB — BASIC METABOLIC PANEL
BUN: 14 mg/dL (ref 6–23)
CHLORIDE: 104 meq/L (ref 96–112)
CO2: 29 mEq/L (ref 19–32)
Calcium: 8.9 mg/dL (ref 8.4–10.5)
Creatinine, Ser: 0.69 mg/dL (ref 0.50–1.10)
Glucose, Bld: 90 mg/dL (ref 70–99)
POTASSIUM: 4.1 meq/L (ref 3.7–5.3)
Sodium: 142 mEq/L (ref 137–147)

## 2013-04-15 NOTE — ED Provider Notes (Signed)
CSN: [id number]     Arrival date & time 04/15/13  1615 History   First MD Initiated Contact with Patient 04/15/13 1642     Chief Complaint  Patient presents with  . Shortness of Breath     (Consider location/radiation/quality/duration/timing/severity/associated sxs/prior Treatment) Patient is a 48 y.o. female presenting with shortness of breath.  Shortness of Breath Associated symptoms: chest pain    Patient developed sudden onset anterior chest pain, pressure-like in quality resolved spontaneously without treatment associated symptoms include shortness of breath. She had a single episode while at rest lasted 10 minutes. No treatment prior to coming here. No other associated symptoms. Presently asymptomatic. Past Medical History  Diagnosis Date  . Hypertension   . Reflux   . IFG (impaired fasting glucose)   . Obesity   . Arthritis   . History of cardiac catheterization    History reviewed. No pertinent past surgical history. Family History  Problem Relation Age of Onset  . Diabetes Mother    History  Substance Use Topics  . Smoking status: Former Smoker -- 15 years    Types: Cigarettes    Quit date: 12/24/1992  . Smokeless tobacco: Not on file  . Alcohol Use: No   OB History   Grav Para Term Preterm Abortions TAB SAB Ect Mult Living                 Review of Systems  Constitutional: Negative.   HENT: Negative.   Respiratory: Positive for shortness of breath.   Cardiovascular: Positive for chest pain.  Gastrointestinal: Negative.   Genitourinary:       Irregular menses  Musculoskeletal: Negative.   Skin: Negative.   Neurological: Negative.   Psychiatric/Behavioral: Negative.   All other systems reviewed and are negative.   Admits to noncompliance with blood pressure medication. She did not take her medication today   Allergies  Lisinopril  Home Medications   Current Outpatient Rx  Name  Route  Sig  Dispense  Refill  . aspirin EC 81 MG tablet   Oral  Take 81 mg by mouth daily.         . hydrochlorothiazide (HYDRODIURIL) 25 MG tablet   Oral   Take 25 mg by mouth daily.         . pantoprazole (PROTONIX) 40 MG tablet   Oral   Take 1 tablet (40 mg total) by mouth daily.   30 tablet   5   . verapamil (CALAN-SR) 180 MG CR tablet   Oral   Take 180 mg by mouth daily.          BP 132/70  Pulse 78  Temp(Src) 97.9 F (36.6 C) (Oral)  Resp 20  Ht 6' (1.829 m)  Wt 312 lb (141.522 kg)  BMI 42.31 kg/m2  SpO2 97%  LMP 02/08/2013 Physical Exam  Nursing note and vitals reviewed. Constitutional: She appears well-developed and well-nourished.  HENT:  Head: Normocephalic and atraumatic.  Eyes: Conjunctivae are normal. Pupils are equal, round, and reactive to light.  Neck: Neck supple. No tracheal deviation present. No thyromegaly present.  Cardiovascular: Normal rate and regular rhythm.   No murmur heard. Pulmonary/Chest: Effort normal and breath sounds normal.  Abdominal: Soft. Bowel sounds are normal. She exhibits no distension. There is no tenderness.  Morbidly obese  Musculoskeletal: Normal range of motion. She exhibits no edema and no tenderness.  Neurological: She is alert. Coordination normal.  Skin: Skin is warm and dry. No rash noted.  Psychiatric: She  has a normal mood and affect.    ED Course  Procedures (including critical care time) Labs Review Labs Reviewed - No data to display Imaging Review No results found.  EKG Interpretation    Date/Time:  Thursday April 15 2013 16:24:34 EST Ventricular Rate:  72 PR Interval:  164 QRS Duration: 92 QT Interval:  366 QTC Calculation: 400 R Axis:   13 Text Interpretation:  Normal sinus rhythm Nonspecific T wave abnormality Abnormal ECG No previous ECGs available Confirmed by Ethelda Chick  MD, Joslynn Jamroz ([id number]) on 04/15/2013 5:13:42 PM           Patient remained asymptomatic throughout her emergency department course. Chest xray viewed by me Results for orders  placed during the hospital encounter of 04/15/13  BASIC METABOLIC PANEL      Result Value Ref Range   Sodium 142  137 - 147 mEq/L   Potassium 4.1  3.7 - 5.3 mEq/L   Chloride 104  96 - 112 mEq/L   CO2 29  19 - 32 mEq/L   Glucose, Bld 90  70 - 99 mg/dL   BUN 14  6 - 23 mg/dL   Creatinine, Ser 6.56  0.50 - 1.10 mg/dL   Calcium 8.9  8.4 - 81.2 mg/dL   GFR calc non Af Amer >90  >90 mL/min   GFR calc Af Amer >90  >90 mL/min  CBC WITH DIFFERENTIAL      Result Value Ref Range   WBC 5.4  4.0 - 10.5 K/uL   RBC 3.70 (*) 3.87 - 5.11 MIL/uL   Hemoglobin 10.7 (*) 12.0 - 15.0 g/dL   HCT 75.1 (*) 70.0 - 17.4 %   MCV 89.5  78.0 - 100.0 fL   MCH 28.9  26.0 - 34.0 pg   MCHC 32.3  30.0 - 36.0 g/dL   RDW 94.4  96.7 - 59.1 %   Platelets 274  150 - 400 K/uL   Neutrophils Relative % 63  43 - 77 %   Neutro Abs 3.4  1.7 - 7.7 K/uL   Lymphocytes Relative 28  12 - 46 %   Lymphs Abs 1.5  0.7 - 4.0 K/uL   Monocytes Relative 8  3 - 12 %   Monocytes Absolute 0.5  0.1 - 1.0 K/uL   Eosinophils Relative 1  0 - 5 %   Eosinophils Absolute 0.1  0.0 - 0.7 K/uL   Basophils Relative 0  0 - 1 %   Basophils Absolute 0.0  0.0 - 0.1 K/uL  TROPONIN I      Result Value Ref Range   Troponin I <0.30  <0.30 ng/mL  TROPONIN I      Result Value Ref Range   Troponin I <0.30  <0.30 ng/mL   Dg Chest 2 View  04/15/2013   CLINICAL DATA:  Short of breath  EXAM: CHEST  2 VIEW  COMPARISON:  None.  FINDINGS: Normal mediastinum and cardiac silhouette. Normal pulmonary vasculature. No evidence of effusion, infiltrate, or pneumothorax. No acute bony abnormality. Degenerative osteophytosis of the thoracic spine.  IMPRESSION: No acute cardiopulmonary process.   Electronically Signed   By: Genevive Bi M.D.   On: 04/15/2013 18:13    MDM   Final diagnoses:  None   Heart rate equals2 I feel that patient is a good candidate for an outpatient cardiac workup. Was cleared from a Cardiologic standpoint for elective knee surgery by Dr.  Gery Pray October 2014. Had near normal cardiac cath 2011. I spoke with  Dr.-Luking. An appointment has been scheduled in his office for further outpatient cardiac workup tomorrow at 11 AM   Dx#1 atypical chest pain #2 anemia #3 medication non compliance   Doug Sou, MD 04/15/13 2136

## 2013-04-15 NOTE — ED Notes (Signed)
Pt reporting improvement in breathing.   Pt comfortable, talking and laughing with family. No distress noted at this time.

## 2013-04-15 NOTE — ED Notes (Signed)
Per EMS, pt arrived at work and reported sudden onset of sob,chest tightness that quickly subsided. Per EMS, staff at pt's work reported pt was hyperventilating but was back at baseline upon EMS arrival. Mild SOB noted with ambulation from EMS stretcher to ED bed. Pt reports fatigue at this time. Pt denies any pain. nad noted.

## 2013-04-15 NOTE — Discharge Instructions (Signed)
Chest Pain (Nonspecific) Our lab work shows that you're mildly anemic, with hemoglobin of 10.7. Other lab work is normal. Should these instructions to Dr.Luking when you go to your appointment tomorrow at 11 AM at his office Chest pain has many causes. Your pain could be caused by something serious, such as a heart attack or a blood clot in the lungs. It could also be caused by something less serious, such as a chest bruise or a virus. Follow up with your doctor. More lab tests or other studies may be needed to find the cause of your pain. Most of the time, nonspecific chest pain will improve within 2 to 3 days of rest and mild pain medicine. HOME CARE  For chest bruises, you may put ice on the sore area for 15-20 minutes, 03-04 times a day. Do this only if it makes you feel better.  Put ice in a plastic bag.  Place a towel between the skin and the bag.  Rest for the next 2 to 3 days.  Go back to work if the pain improves.  See your doctor if the pain lasts longer than 1 to 2 weeks.  Only take medicine as told by your doctor.  Quit smoking if you smoke. GET HELP RIGHT AWAY IF:   There is more pain or pain that spreads to the arm, neck, jaw, back, or belly (abdomen).  You have shortness of breath.  You cough more than usual or cough up blood.  You have very bad back or belly pain, feel sick to your stomach (nauseous), or throw up (vomit).  You have very bad weakness.  You pass out (faint).  You have a fever. Any of these problems may be serious and may be an emergency. Do not wait to see if the problems will go away. Get medical help right away. Call your local emergency services 911 in [place] Do not drive yourself to the hospital. MAKE SURE YOU:   Understand these instructions.  Will watch this condition.  Will get help right away if you or your child is not doing well or gets worse. Document Released: 07/31/2007 Document Revised: 05/06/2011 Document Reviewed:  07/31/2007 [facility] Patient Information 2014 [place], [place].

## 2013-04-16 ENCOUNTER — Encounter: Payer: Self-pay | Admitting: Family Medicine

## 2013-04-16 ENCOUNTER — Ambulatory Visit (INDEPENDENT_AMBULATORY_CARE_PROVIDER_SITE_OTHER): Payer: 59 | Admitting: Family Medicine

## 2013-04-16 VITALS — BP 122/88 | Ht 72.0 in | Wt 310.0 lb

## 2013-04-16 DIAGNOSIS — Z9889 Other specified postprocedural states: Secondary | ICD-10-CM

## 2013-04-16 DIAGNOSIS — K219 Gastro-esophageal reflux disease without esophagitis: Secondary | ICD-10-CM

## 2013-04-16 DIAGNOSIS — I10 Essential (primary) hypertension: Secondary | ICD-10-CM

## 2013-04-16 DIAGNOSIS — R079 Chest pain, unspecified: Secondary | ICD-10-CM

## 2013-04-16 MED ORDER — VERAPAMIL HCL ER 180 MG PO TBCR: 180.0000 mg | EXTENDED_RELEASE_TABLET | Freq: Every day | ORAL | Status: AC

## 2013-04-16 MED ORDER — PANTOPRAZOLE SODIUM 40 MG PO TBEC: 40.0000 mg | DELAYED_RELEASE_TABLET | Freq: Every day | ORAL | Status: AC

## 2013-04-16 NOTE — Progress Notes (Signed)
   Subjective:    Patient ID: Kelly Ramos, female    DOB: [birth year], 48 y.o.   MRN: [id number]  HPIFollow up on chest pain. [facility] ED [facility]. Pt felt tightness all of sudden,  Breath got short  Lasted for a few minutres  ems went to the site and transported  Uses reflux med off and on,states that it does help her.  Bilateral ankle pain and swelling for over 1 month.   Patient brought in bloodwork that was done at employee health on 12/02/12.  Needs refill on verapamil. 30 day supply. 558 - 1137 Strong family history early coronary artery disease. Brother had stenting at age 78. Review of Systems No back pain no abdominal pain no change in bowel habits no rash no loss of consciousness ROS otherwise negative    Objective:   Physical Exam Alert no apparent distress. Vitals reviewed. H&T normal. Lungs clear. Heart regular rate and rhythm. Chest wall nontender. Abdomen benign.       Assessment & Plan:  Impression 1 chest pain. Atypical nature. Negative cath just several years ago, however history of premature heart disease and individual risk factors. #2 hypertension decent control. #3 reflux discussed encouraged to take medicine daily. Plan cardiology read for all rationale discussed. Recommend refilled. Hold off on significant exertion pending cardiology workup WSL

## 2013-04-17 DIAGNOSIS — K219 Gastro-esophageal reflux disease without esophagitis: Secondary | ICD-10-CM | POA: Insufficient documentation

## 2013-04-22 ENCOUNTER — Ambulatory Visit: Payer: 59 | Admitting: Cardiology

## 2013-04-23 ENCOUNTER — Ambulatory Visit: Payer: 59 | Admitting: Cardiology

## 2013-05-05 ENCOUNTER — Encounter: Payer: Self-pay | Admitting: Family Medicine

## 2013-05-05 ENCOUNTER — Ambulatory Visit (INDEPENDENT_AMBULATORY_CARE_PROVIDER_SITE_OTHER): Payer: 59 | Admitting: Family Medicine

## 2013-05-05 VITALS — BP 128/82 | Ht 72.0 in | Wt 310.0 lb

## 2013-05-05 DIAGNOSIS — M130 Polyarthritis, unspecified: Secondary | ICD-10-CM

## 2013-05-05 DIAGNOSIS — M129 Arthropathy, unspecified: Secondary | ICD-10-CM

## 2013-05-05 DIAGNOSIS — M069 Rheumatoid arthritis, unspecified: Secondary | ICD-10-CM | POA: Insufficient documentation

## 2013-05-05 DIAGNOSIS — M1990 Unspecified osteoarthritis, unspecified site: Secondary | ICD-10-CM

## 2013-05-05 MED ORDER — HYDROCODONE-ACETAMINOPHEN 5-325 MG PO TABS: 1.0000 | ORAL_TABLET | Freq: Four times a day (QID) | ORAL | Status: DC | PRN

## 2013-05-05 MED ORDER — PREDNISONE 10 MG PO TABS: ORAL_TABLET | ORAL | Status: DC

## 2013-05-05 MED ORDER — METHYLPREDNISOLONE ACETATE 40 MG/ML IJ SUSP
40.0000 mg | Freq: Once | INTRAMUSCULAR | Status: AC
  Administered 2013-05-05: 40 mg via INTRAMUSCULAR

## 2013-05-05 NOTE — Progress Notes (Signed)
   Subjective:    Patient ID: Kelly Ramos, female    DOB: Apr 09, 1965, 48 y.o.   MRN: [id number]  HPI Patient arrives for follow up from urgent care. Patient having a lot of joint pain-was dx with rheumatoid arthritis. Has appt with rheumatology on April 20. Patient having severe pain-was given pain med from urgent care but did not get it filled but is finishing up steroid taper.  Had blood test which showed elevated sed rate at 100, and high rheum factor and ANA  Patient presents with progressive pain in the joints.  Saw an orthopedic doctor a month ago. 4 foot and ankle pain. They recommended rheumatology consult and told patient it was 1 up. This never happened.  Present to Dr. Cheri Guppy last week. Was noted to have swollen inflamed wrist joint given a prednisone Dosepak and significant blood work.  Returns with blood work abnormalities  Patient reports frustration that she cannot see rheumatologist for another month.   Review of Systems No chest pain no headache no neck pain no shortness of breath no abdominal pain ROS otherwise    Objective:   Physical Exam  Alert mild malaise. Tearful. Lungs clear. Heart regular rate rhythm. Right wrist inflamed swollen tender right ankle somewhat swollen tender.      Assessment & Plan:  Impression polyarthritis with blood work abnormalities. Discussed at great length. This likely represents a true arthritis. Unfortunately rheumatoid arthritis is at the top of this. Doubt gallop do to be polyarthritis presentation though possible. Doubt lupus do to a generic female status. All this discussed with patient. Plan prednisone taper. Stretched out. Hydrocodone when necessary. Steroid injection per patient request. We will try to see if Dr. Sunday Spillers slow to see earlier. WSL

## 2013-05-17 ENCOUNTER — Ambulatory Visit: Payer: 59 | Admitting: Cardiology

## 2013-05-20 ENCOUNTER — Encounter: Payer: Self-pay | Admitting: Cardiology

## 2013-05-20 ENCOUNTER — Ambulatory Visit (INDEPENDENT_AMBULATORY_CARE_PROVIDER_SITE_OTHER): Payer: 59 | Admitting: Cardiology

## 2013-05-20 VITALS — BP 136/86 | HR 67 | Ht 72.0 in | Wt 297.0 lb

## 2013-05-20 DIAGNOSIS — R079 Chest pain, unspecified: Secondary | ICD-10-CM

## 2013-05-20 DIAGNOSIS — R0602 Shortness of breath: Secondary | ICD-10-CM

## 2013-05-20 NOTE — Progress Notes (Signed)
Clinical Summary Kelly Ramos is a 48 y.o.female former patient of Dr Allyson Sabal, this is our first visit together. She is seen for the following medical problems.  1. Chest pain - strong family history of CAD - history of prior abnormal nuclear stress test, cath was unremarkable in [place] in 2011 - prior ER visit 03/2013 with chest pain,  - pressure like feeling in midchest, 5/10. Occurred while walking to work. +SOB, no other symptoms. Nothing made pain better or worst. Pain lasted for just a few minutes. No repeat episodes since - has noticed a mild increase in DOE. No orthopnea, no PND  CAD risk factors: brother MI in 12s, HTN.      Past Medical History  Diagnosis Date  . Hypertension   . Reflux   . IFG (impaired fasting glucose)   . Obesity   . Arthritis   . History of cardiac catheterization      Allergies  Allergen Reactions  . Lisinopril Itching     Current Outpatient Prescriptions  Medication Sig Dispense Refill  . aspirin EC 81 MG tablet Take 81 mg by mouth daily.      . hydrochlorothiazide (HYDRODIURIL) 25 MG tablet Take 25 mg by mouth daily.      Marland Kitchen HYDROcodone-acetaminophen (NORCO/VICODIN) 5-325 MG per tablet Take 1 tablet by mouth every 6 (six) hours as needed.  36 tablet  0  . pantoprazole (PROTONIX) 40 MG tablet Take 1 tablet (40 mg total) by mouth daily.  30 tablet  5  . predniSONE (DELTASONE) 10 MG tablet 3 tabs a day for 7 days, then 2 tabs a day for 7 days and then one tab a day for 14 days  49 tablet  0  . verapamil (CALAN-SR) 180 MG CR tablet Take 1 tablet (180 mg total) by mouth daily.  30 tablet  5   No current facility-administered medications for this visit.     No past surgical history on file.   Allergies  Allergen Reactions  . Lisinopril Itching      Family History  Problem Relation Age of Onset  . Diabetes Mother      Social History Kelly Ramos reports that she quit smoking about 20 years ago. Her smoking use included  Cigarettes. She smoked 0.00 packs per day for 15 years. She does not have any smokeless tobacco history on file. Kelly Ramos reports that she does not drink alcohol.   Review of Systems CONSTITUTIONAL: No weight loss, fever, chills, weakness or fatigue.  HEENT: Eyes: No visual loss, blurred vision, double vision or yellow sclerae.No hearing loss, sneezing, congestion, runny nose or sore throat.  SKIN: No rash or itching.  CARDIOVASCULAR: per HPI RESPIRATORY: No shortness of breath, cough or sputum.  GASTROINTESTINAL: No anorexia, nausea, vomiting or diarrhea. No abdominal pain or blood.  GENITOURINARY: No burning on urination, no polyuria NEUROLOGICAL: No headache, dizziness, syncope, paralysis, ataxia, numbness or tingling in the extremities. No change in bowel or bladder control.  MUSCULOSKELETAL: No muscle, back pain, joint pain or stiffness.  LYMPHATICS: No enlarged nodes. No history of splenectomy.  PSYCHIATRIC: No history of depression or anxiety.  ENDOCRINOLOGIC: No reports of sweating, cold or heat intolerance. No polyuria or polydipsia.  Marland Kitchen   Physical Examination p 67 bp 136/86 Wt 297 lbs BMI 40 Gen: resting comfortably, no acute distress HEENT: no scleral icterus, pupils equal round and reactive, no palptable cervical adenopathy,  CV: RRR, no m/r/g, no JVD, no carotid bruits Resp:  Clear to auscultation bilaterally GI: abdomen is soft, non-tender, non-distended, normal bowel sounds, no hepatosplenomegaly MSK: extremities are warm, no edema.  Skin: warm, no rash Neuro:  no focal deficits Psych: appropriate affect   Diagnostic Studies 12/2009 Cath [facility] LVEF 55-60%, LM normal, LAD normal, LCX normal, RCA normal.     Assessment and Plan  1. Chest pain - prior history of chest pain with abnormal stress in 2011, followed by cath with patent coronaries - isolated episode of chest pain 03/2013, negative evalation in ER for ACS. No pain since that time - has noted mild  increase in DOE over last several weeks - will obtain echo to further evaluate, defer ischemic evaluation unless she has repeat symptoms    Follow up 3 months   Antoine Poche, M.D., F.A.C.C.

## 2013-05-20 NOTE — Patient Instructions (Signed)
Your physician has requested that you have an echocardiogram. Echocardiography is a painless test that uses sound waves to create images of your heart. It provides your doctor with information about the size and shape of your heart and how well your heart's chambers and valves are working. This procedure takes approximately one hour. There are no restrictions for this procedure. Office will contact with results via phone or letter.   Continue all current medications. Follow up in  3 months   

## 2013-05-25 ENCOUNTER — Ambulatory Visit: Payer: 59 | Admitting: Family Medicine

## 2013-06-01 ENCOUNTER — Telehealth: Payer: Self-pay | Admitting: Family Medicine

## 2013-06-01 NOTE — Telephone Encounter (Signed)
FYI - pt was seen by Dr. Kellie Simmering on 05/10/13 and has a follow up 06/15/13, Crystal from Dr. Ines Bloomer office states that this patient had 3 different doctors referring her

## 2013-06-01 NOTE — Telephone Encounter (Signed)
Ok, for instance, pt was told by ortho over a month ago they'd refer but she never heard bk fr anyone, pt has true pplyarthritis and high sed rate needs to see hjim

## 2013-06-03 ENCOUNTER — Other Ambulatory Visit (INDEPENDENT_AMBULATORY_CARE_PROVIDER_SITE_OTHER): Payer: 59

## 2013-06-03 ENCOUNTER — Other Ambulatory Visit: Payer: Self-pay

## 2013-06-03 DIAGNOSIS — R079 Chest pain, unspecified: Secondary | ICD-10-CM

## 2013-06-03 DIAGNOSIS — R0602 Shortness of breath: Secondary | ICD-10-CM

## 2013-06-04 ENCOUNTER — Ambulatory Visit (INDEPENDENT_AMBULATORY_CARE_PROVIDER_SITE_OTHER): Payer: 59 | Admitting: Nurse Practitioner

## 2013-06-04 ENCOUNTER — Encounter: Payer: Self-pay | Admitting: Nurse Practitioner

## 2013-06-04 VITALS — BP 128/80 | Ht 72.0 in | Wt 292.0 lb

## 2013-06-04 DIAGNOSIS — Z13220 Encounter for screening for lipoid disorders: Secondary | ICD-10-CM

## 2013-06-04 DIAGNOSIS — G629 Polyneuropathy, unspecified: Secondary | ICD-10-CM

## 2013-06-04 DIAGNOSIS — R7301 Impaired fasting glucose: Secondary | ICD-10-CM

## 2013-06-04 DIAGNOSIS — M130 Polyarthritis, unspecified: Secondary | ICD-10-CM

## 2013-06-04 DIAGNOSIS — Z1231 Encounter for screening mammogram for malignant neoplasm of breast: Secondary | ICD-10-CM

## 2013-06-04 DIAGNOSIS — G609 Hereditary and idiopathic neuropathy, unspecified: Secondary | ICD-10-CM

## 2013-06-04 DIAGNOSIS — R32 Unspecified urinary incontinence: Secondary | ICD-10-CM

## 2013-06-04 DIAGNOSIS — E559 Vitamin D deficiency, unspecified: Secondary | ICD-10-CM | POA: Insufficient documentation

## 2013-06-04 LAB — POCT URINALYSIS DIPSTICK
PH UA: 5
Spec Grav, UA: 1.025

## 2013-06-07 ENCOUNTER — Encounter: Payer: Self-pay | Admitting: Nurse Practitioner

## 2013-06-07 ENCOUNTER — Encounter: Payer: Self-pay | Admitting: *Deleted

## 2013-06-07 NOTE — Progress Notes (Signed)
Subjective:  Presents for several issues. Recently had abnormal labs on 04/28/2013. Was seen by orthopedic specialist who recommended referral to rheumatology. Has had her initial appointment. Has an appointment for followup with Dr. Kellie Simmering on 4/21. Complaints of fluid in the right lower leg particularly in the ankle area with extreme tenderness. Also in the right hand/wrist area for about 2 weeks.. Was told by rheumatologist that she probably has gout. Was on colchicine up until about a week ago with minimal relief. Has indomethacin at home. Requesting routine mammogram. Small cystic area on the right inner breast that is been there for a long time, nontender. Some urinary urgency and frequency, no dysuria. No pelvic pain. No mid back pain. Having significant numbness in both feet only in the mornings. Improves after she moves around. History of vitamin D deficiency which was treated with prescription vitamin D March 2014.  Objective:   BP 128/80  Ht 6' (1.829 m)  Wt 292 lb (132.45 kg)  BMI 39.59 kg/m2 NAD. Alert, oriented. Lungs clear. No CVA area tenderness. Heart regular rate rhythm. Abdomen soft nondistended without obvious tenderness. Patient examined in chair, unable to get up on examining table. Significant edema mild warmth and extreme tenderness noted in the right ankle joint. Similar but less intense symptoms noted in the right wrist area. Mild edema of the hand and fingers. Labs dated 04/28/2013 showed elevated RA factor, elevated uric acid level, positive ANA and elevated sedimentation rate at 100. Small nonerythematous nontender superficial sebaceous cyst noted in the right inner breast area. Results for orders placed in visit on 06/04/13  POCT URINALYSIS DIPSTICK      Result Value Ref Range   Color, UA       Clarity, UA       Glucose, UA       Bilirubin, UA       Ketones, UA       Spec Grav, UA 1.025     Blood, UA       pH, UA 5.0     Protein, UA       Urobilinogen, UA       Nitrite, UA       Leukocytes, UA         Assessment: Problem List Items Addressed This Visit     Endocrine   Impaired fasting glucose   Relevant Orders      Hemoglobin A1c     Musculoskeletal and Integument   Polyarthritis - Primary     Other   Unspecified vitamin D deficiency   Relevant Orders      Vit D  25 hydroxy (rtn osteoporosis monitoring)    Other Visit Diagnoses   Urine incontinence        Relevant Orders       POCT urinalysis dipstick (Completed)    Other screening mammogram        Relevant Orders       MM Digital Screening    Peripheral neuropathy        Relevant Orders       Ferritin    Need for lipid screening        Relevant Orders       Lipid panel      Plan: Start indomethacin that she has at home as directed, take with food, DC if any acid reflux or abdominal pain. Followup with rheumatologist as planned. Lab work pending. Return in about 3 months (around 09/03/2013). Call back sooner if needed.

## 2013-06-08 ENCOUNTER — Ambulatory Visit (HOSPITAL_COMMUNITY): Payer: 59

## 2013-06-08 ENCOUNTER — Telehealth: Payer: Self-pay | Admitting: Nurse Practitioner

## 2013-06-08 MED ORDER — HYDROCODONE-ACETAMINOPHEN 5-325 MG PO TABS: 1.0000 | ORAL_TABLET | Freq: Four times a day (QID) | ORAL | Status: DC | PRN

## 2013-06-08 NOTE — Telephone Encounter (Signed)
HYDROcodone-acetaminophen (NORCO/VICODIN) 5-325 MG per tablet  Please refill due to arm pain  Last seen: 06/04/13  Last refilled: 05/05/13

## 2013-06-08 NOTE — Telephone Encounter (Signed)
May ref times one, hopefully rheumatlogist will come up with intervention which will help with pts pain (let her know this)

## 2013-06-08 NOTE — Telephone Encounter (Signed)
Rx up front for patient pick up. Patient notified. 

## 2013-06-10 ENCOUNTER — Ambulatory Visit (HOSPITAL_COMMUNITY)
Admission: RE | Admit: 2013-06-10 | Discharge: 2013-06-10 | Disposition: A | Discharge status: Home / Self Care | Payer: 59 | Source: Ambulatory Visit | Attending: Nurse Practitioner | Admitting: Nurse Practitioner

## 2013-06-10 DIAGNOSIS — Z1231 Encounter for screening mammogram for malignant neoplasm of breast: Secondary | ICD-10-CM | POA: Insufficient documentation

## 2013-06-17 LAB — HEMOGLOBIN A1C
HEMOGLOBIN A1C: 6 % — AB (ref <5.7)
Mean Plasma Glucose: 126 mg/dL — ABNORMAL HIGH (ref <117)

## 2013-06-17 LAB — LIPID PANEL
CHOLESTEROL: 179 mg/dL (ref 0–200)
HDL: 53 mg/dL (ref >39)
LDL Cholesterol: 114 mg/dL — ABNORMAL HIGH (ref 0–99)
TRIGLYCERIDES: 60 mg/dL (ref <150)
Total CHOL/HDL Ratio: 3.4 Ratio
VLDL: 12 mg/dL (ref 0–40)

## 2013-06-17 LAB — FERRITIN: FERRITIN: 126 ng/mL (ref 10–291)

## 2013-06-17 LAB — VITAMIN D 25 HYDROXY (VIT D DEFICIENCY, FRACTURES): Vit D, 25-Hydroxy: 43 ng/mL (ref 30–89)

## 2013-06-18 ENCOUNTER — Encounter: Payer: Self-pay | Admitting: Nurse Practitioner

## 2013-07-07 ENCOUNTER — Ambulatory Visit (INDEPENDENT_AMBULATORY_CARE_PROVIDER_SITE_OTHER): Payer: 59 | Admitting: Nurse Practitioner

## 2013-07-07 ENCOUNTER — Encounter: Payer: Self-pay | Admitting: Nurse Practitioner

## 2013-07-07 VITALS — BP 132/90 | Ht 72.0 in | Wt 302.0 lb

## 2013-07-07 DIAGNOSIS — R7301 Impaired fasting glucose: Secondary | ICD-10-CM

## 2013-07-07 DIAGNOSIS — M109 Gout, unspecified: Secondary | ICD-10-CM

## 2013-07-07 DIAGNOSIS — I10 Essential (primary) hypertension: Secondary | ICD-10-CM

## 2013-07-07 MED ORDER — NAPROXEN 500 MG PO TABS: 500.0000 mg | ORAL_TABLET | Freq: Two times a day (BID) | ORAL | Status: DC

## 2013-07-09 ENCOUNTER — Encounter: Payer: Self-pay | Admitting: Nurse Practitioner

## 2013-07-09 DIAGNOSIS — M109 Gout, unspecified: Secondary | ICD-10-CM | POA: Insufficient documentation

## 2013-07-09 NOTE — Progress Notes (Signed)
Subjective:  Presents to review her mammogram and lab work. Sees Dr. Kellie Simmering tomorrow, hopefully will come off her prednisone that she is taking for gout. Takes minimal amount of pain medicine. Would like to switch to naproxen.  Objective:   BP 132/90  Ht 6' (1.829 m)  Wt 302 lb (136.986 kg)  BMI 40.95 kg/m2 NAD. Alert, oriented. Lungs clear. Heart regular rate rhythm. Reviewed lab work from 06/16/2013 and mammogram from 06/04/2013.  Assessment:  Problem List Items Addressed This Visit     Cardiovascular and Mediastinum   Essential hypertension - Primary     Endocrine   Impaired fasting glucose    Other Visit Diagnoses   Gout          Plan: Meds ordered this encounter  Medications  . naproxen (NAPROSYN) 500 MG tablet    Sig: Take 1 tablet (500 mg total) by mouth 2 (two) times daily with a meal.    Dispense:  60 tablet    Refill:  0    Order Specific Question:  Supervising Provider    Answer:  Merlyn Albert [[id number]]   Patient may start naproxen once her prednisone is complete. Take medication with food. Recheck in 3 months, call back sooner if needed.

## 2013-07-23 ENCOUNTER — Ambulatory Visit (INDEPENDENT_AMBULATORY_CARE_PROVIDER_SITE_OTHER): Payer: 59 | Admitting: Family Medicine

## 2013-07-23 ENCOUNTER — Encounter: Payer: Self-pay | Admitting: Family Medicine

## 2013-07-23 ENCOUNTER — Telehealth: Payer: Self-pay | Admitting: Family Medicine

## 2013-07-23 VITALS — BP 122/80 | Ht 72.0 in | Wt 307.0 lb

## 2013-07-23 DIAGNOSIS — M130 Polyarthritis, unspecified: Secondary | ICD-10-CM

## 2013-07-23 NOTE — Telephone Encounter (Signed)
Pt wants to know that since you wrote her out of work for 30 days, does she need to follow up with you before returning to work?  Please advise Please advise

## 2013-07-23 NOTE — Telephone Encounter (Signed)
[facility] for pt, Dr. Brett Canales would like to recheck with pt in 4 weeks, please schedule

## 2013-07-23 NOTE — Telephone Encounter (Signed)
I wrote her out for thirty more in hopes that we would have a specialist by then she trusts and is helping her--not sure about that. Go ahead and sched appt with me in four wks

## 2013-07-23 NOTE — Progress Notes (Signed)
   Subjective:    Patient ID: Kelly Ramos, female    DOB: Oct 15, 1965, 47 y.o.   MRN: [id number]  HPI Patient states that she has seen Dr. Kellie Simmering for her joint pain and he diagnosed her with gout. Patient states that she doesn't believe this is gout because she is taking the medication and her joints are still painful and swelling. She has had 3 cortisone injections without any relief.   Patient states she has no other concerns at this time.   Uric acid was high pt treated for presumed gout, joints still swoollen and painful  Wrists shoulders nd joints still painful  Naproxen helping a little, pred doesn't seem to help much.  Colchicine pt on faithfully. Not helping much. Next  Right wrist is swollen and painful. Right ankle is swollen and painful. Patient states there is no way that she can work 2 days from now. Review of Systems No fever no chest pain no back pain no abdominal pain no change in bowel habits no weight loss no blood in stool ROS otherwise    Objective:   Physical Exam Alert slight malaise. Vitals reviewed. Lungs clear. Heart rare rhythm. Right wrist distinct inflammation swelling even warmth. Right ankle some more enlargement tenderness pain with flexion extension.       Assessment & Plan:  Impression polyarthritis very long discussion held. Ashby Dawes of difficulty of diagnosis discussed at length. I agreed with patient that it seems there is something more than just gout going on. There is no way possible she can work with these acutely inflamed and hot joints. Plan maintain same therapy. Work excuse next 30 days. Rationale discussed. We will work on yet another rheumatology referral. Discussed with patient. WSL

## 2013-07-23 NOTE — Telephone Encounter (Signed)
appt scheduled

## 2013-07-27 DIAGNOSIS — Z0289 Encounter for other administrative examinations: Secondary | ICD-10-CM

## 2013-08-12 ENCOUNTER — Other Ambulatory Visit: Payer: Self-pay | Admitting: Nurse Practitioner

## 2013-08-20 ENCOUNTER — Encounter: Payer: Self-pay | Admitting: Family Medicine

## 2013-08-20 ENCOUNTER — Ambulatory Visit (INDEPENDENT_AMBULATORY_CARE_PROVIDER_SITE_OTHER): Payer: 59 | Admitting: Family Medicine

## 2013-08-20 ENCOUNTER — Ambulatory Visit (HOSPITAL_COMMUNITY)
Admission: RE | Admit: 2013-08-20 | Discharge: 2013-08-20 | Disposition: A | Discharge status: Home / Self Care | Payer: 59 | Source: Ambulatory Visit | Attending: Family Medicine | Admitting: Family Medicine

## 2013-08-20 ENCOUNTER — Ambulatory Visit: Payer: 59 | Admitting: Cardiology

## 2013-08-20 VITALS — BP 122/74 | Ht 72.0 in | Wt 302.0 lb

## 2013-08-20 DIAGNOSIS — M79609 Pain in unspecified limb: Secondary | ICD-10-CM

## 2013-08-20 DIAGNOSIS — M130 Polyarthritis, unspecified: Secondary | ICD-10-CM

## 2013-08-20 DIAGNOSIS — M79642 Pain in left hand: Secondary | ICD-10-CM

## 2013-08-20 DIAGNOSIS — K219 Gastro-esophageal reflux disease without esophagitis: Secondary | ICD-10-CM

## 2013-08-20 DIAGNOSIS — I10 Essential (primary) hypertension: Secondary | ICD-10-CM

## 2013-08-20 DIAGNOSIS — M19049 Primary osteoarthritis, unspecified hand: Secondary | ICD-10-CM | POA: Insufficient documentation

## 2013-08-20 DIAGNOSIS — M79641 Pain in right hand: Secondary | ICD-10-CM

## 2013-08-20 MED ORDER — HYDROCODONE-ACETAMINOPHEN 10-325 MG PO TABS: ORAL_TABLET | ORAL | Status: DC

## 2013-08-20 MED ORDER — PREDNISONE 20 MG PO TABS: ORAL_TABLET | ORAL | Status: DC

## 2013-08-20 NOTE — Progress Notes (Signed)
   Subjective:    Patient ID: Kelly Ramos, female    DOB: [birth year], 48 y.o.   MRN: [id number]  HPIFollow up on gout in both feet. Taking naproxen, hydrocodone 2 at a time, and prednisone. Feeling worse.   Now  Having pain hands, knees,  and shoulders.  Has appt with rheumology on August 3rd. Patient understandably frustrated that she has to wait that long.  States ongoing significant swelling and hands ankles. Also knees. Warmth in nature. No measurable fever. Discomfort very extreme.  Has been told this is all gout however she feels there are other things going on. Of note did have rheumatoid factor that was positive in past. Also rheumatologist expressed some concern that she might have an element of rheumatoid arthritis.  Review of Systems No chest pain no headache no back pain no abdominal pain no change in bowel habits no fever no blood in stool ROS otherwise negative.    Objective:   Physical Exam  Alert moderate malaise. Impressive swelling erythema warmth and hands ankle right knee. Ankles no edema lungs clear. Heart regular rate and rhythm.      Assessment & Plan:  Impression poly-arthritis with exact diagnosis still uncertain. Patient likely a candidate for disease modifying agents. Pain control is poor. Discussed at length. 25 minutes spent most in discussion. Plan 1 increase prednisone. Pain medicine refilled. Work excuse given. X-ray most aggravated hand. WSL

## 2013-08-23 ENCOUNTER — Encounter: Payer: Self-pay | Admitting: Family Medicine

## 2013-08-24 ENCOUNTER — Encounter: Payer: Self-pay | Admitting: Cardiology

## 2013-08-24 ENCOUNTER — Ambulatory Visit (INDEPENDENT_AMBULATORY_CARE_PROVIDER_SITE_OTHER): Payer: 59 | Admitting: Cardiology

## 2013-08-24 VITALS — BP 122/81 | HR 101 | Ht 72.0 in | Wt 306.0 lb

## 2013-08-24 DIAGNOSIS — R0789 Other chest pain: Secondary | ICD-10-CM

## 2013-08-24 DIAGNOSIS — I10 Essential (primary) hypertension: Secondary | ICD-10-CM

## 2013-08-24 NOTE — Progress Notes (Signed)
Clinical Summary Kelly Ramos is a 48 y.o.female seen today for follow up of the following medical problems.  1. Chest pain  - strong family history of CAD  - history of prior abnormal nuclear stress test, cath was unremarkable in [place] in 2011  - prior ER visit 03/2013 with chest pain,  - pressure like feeling in midchest, 5/10. Occurred while walking to work. +SOB, no other symptoms. Nothing made pain better or worst. Pain lasted for just a few minutes. No repeat episodes since  - has noticed a mild increase in DOE. No orthopnea, no PND  CAD risk factors: brother MI in 23s, HTN.  - has had some chest pain at times since discharge. Had a constant pain that lasted continously for 2 weeks, worst with movement and cough. Has resolved.   2. Joint pain - being worked up by [employer], considering rheum referral.   Past Medical History  Diagnosis Date  . Hypertension   . Reflux   . IFG (impaired fasting glucose)   . Obesity   . Arthritis   . History of cardiac catheterization      Allergies  Allergen Reactions  . Lisinopril Itching     Current Outpatient Prescriptions  Medication Sig Dispense Refill  . allopurinol (ZYLOPRIM) 300 MG tablet Take 300 mg by mouth daily.      Marland Kitchen BIOTIN PO Take by mouth.      . hydrochlorothiazide (HYDRODIURIL) 25 MG tablet Take 25 mg by mouth daily.      Marland Kitchen HYDROcodone-acetaminophen (NORCO) 10-325 MG per tablet Take one half tablet daily and one tablet at bedtime  45 tablet  0  . naproxen (NAPROSYN) 500 MG tablet TAKE 1 TABLET BY MOUTH TWICE DAILY WITH A MEAL  60 tablet  0  . pantoprazole (PROTONIX) 40 MG tablet Take 1 tablet (40 mg total) by mouth daily.  30 tablet  5  . predniSONE (DELTASONE) 20 MG tablet Take one and a half tablets daily  45 tablet  0  . verapamil (CALAN-SR) 180 MG CR tablet Take 1 tablet (180 mg total) by mouth daily.  30 tablet  5   No current facility-administered medications for this visit.     No past surgical history  on file.   Allergies  Allergen Reactions  . Lisinopril Itching      Family History  Problem Relation Age of Onset  . Diabetes Mother      Social History Kelly Ramos reports that she quit smoking about 20 years ago. Her smoking use included Cigarettes. She smoked 0.00 packs per day for 15 years. She has never used smokeless tobacco. Kelly Ramos reports that she does not drink alcohol.   Review of Systems CONSTITUTIONAL: No weight loss, fever, chills, weakness or fatigue.  HEENT: Eyes: No visual loss, blurred vision, double vision or yellow sclerae.No hearing loss, sneezing, congestion, runny nose or sore throat.  SKIN: No rash or itching.  CARDIOVASCULAR: per HPI  RESPIRATORY: No shortness of breath, cough or sputum.  GASTROINTESTINAL: No anorexia, nausea, vomiting or diarrhea. No abdominal pain or blood.  GENITOURINARY: No burning on urination, no polyuria NEUROLOGICAL: No headache, dizziness, syncope, paralysis, ataxia, numbness or tingling in the extremities. No change in bowel or bladder control.  MUSCULOSKELETAL: + joint pain LYMPHATICS: No enlarged nodes. No history of splenectomy.  PSYCHIATRIC: No history of depression or anxiety.  ENDOCRINOLOGIC: No reports of sweating, cold or heat intolerance. No polyuria or polydipsia.  Marland Kitchen   Physical  Examination p 101 bp 122/81 Wt 306 lbs BMI 41 Gen: resting comfortably, no acute distress HEENT: no scleral icterus, pupils equal round and reactive, no palptable cervical adenopathy,  CV: RRR, no m/r/g, no JVD, no carotid bruits Resp: Clear to auscultation bilaterally GI: abdomen is soft, non-tender, non-distended, normal bowel sounds, no hepatosplenomegaly MSK: extremities are warm, no edema.  Skin: warm, no rash Neuro:  no focal deficits Psych: appropriate affect   Diagnostic Studies 12/2009 Cath [facility]  LVEF 55-60%, LM normal, LAD normal, LCX normal, RCA normal.   05/2013 Echo Study Conclusions  - Left ventricle:  The cavity size was normal. Wall thickness was normal. Systolic function was normal. The estimated ejection fraction was in the range of 55% to 60%. Wall motion was normal; there were no regional wall motion abnormalities. Left ventricular diastolic function parameters were normal. - Aortic valve: Mildly calcified annulus. Trileaflet. No significant regurgitation. - Mitral valve: Mildly thickened leaflets . Trivial regurgitation. - Right atrium: Central venous pressure: 92mm Hg (est). - Tricuspid valve: Physiologic regurgitation. - Pulmonary arteries: Systolic pressure could not be accurately estimated. - Pericardium, extracardiac: There was no pericardial effusion. Impressions:  - Normal LV wall thickness and chamber size, LVEF 55-60%. Overall normal diastolic function. Mildly thickened mitral valve with trivial mitral regurgitation. Unable to assess PASP. CVP appears normal. No pericardial effusion.     Assessment and Plan  1. Chest pain  - prior history of chest pain with abnormal stress in 2011, followed by cath with patent coronaries  - isolated episode of chest pain 03/2013, negative evaluation in ER for ACS. Has had some atypical chest pain at times.  - continue risk factor modification       Antoine Poche, M.D., F.A.C.C.

## 2013-08-24 NOTE — Patient Instructions (Signed)
Continue all current medications. Your physician wants you to follow up in:  1 year.  You will receive a reminder letter in the mail one-two months in advance.  If you don't receive a letter, please call our office to schedule the follow up appointment   

## 2013-08-31 DIAGNOSIS — Z0289 Encounter for other administrative examinations: Secondary | ICD-10-CM

## 2013-09-07 ENCOUNTER — Telehealth: Payer: Self-pay | Admitting: Family Medicine

## 2013-09-07 NOTE — Telephone Encounter (Signed)
error 

## 2013-10-06 ENCOUNTER — Ambulatory Visit: Payer: 59 | Admitting: Nurse Practitioner

## 2013-12-27 ENCOUNTER — Ambulatory Visit (INDEPENDENT_AMBULATORY_CARE_PROVIDER_SITE_OTHER): Payer: Medicaid Other | Admitting: Nurse Practitioner

## 2013-12-27 VITALS — BP 130/82 | Ht 72.0 in | Wt 312.6 lb

## 2013-12-27 DIAGNOSIS — K219 Gastro-esophageal reflux disease without esophagitis: Secondary | ICD-10-CM

## 2013-12-27 DIAGNOSIS — I10 Essential (primary) hypertension: Secondary | ICD-10-CM

## 2013-12-30 ENCOUNTER — Encounter: Payer: Self-pay | Admitting: Nurse Practitioner

## 2013-12-30 NOTE — Progress Notes (Signed)
Subjective:  Presents for routine follow-up. Stopped her meds for a period of time, restarted all meds last week. Has been started on methotrexate and Enbrel 4 her RA. No chest pain/ischemic type pain or shortness of breath. Difficulty losing weight due to limited activity related to joint pain. Has been experiencing some constipation. Acid reflux stable.  Objective:   BP 130/82 mmHg  Ht 6' (1.829 m)  Wt 312 lb 9.6 oz (141.794 kg)  BMI 42.39 kg/m2 NAD. Alert, oriented. Lungs clear. Heart regular rate rhythm. Abdomen obese soft nondistended nontender.  Assessment:  Problem List Items Addressed This Visit      Cardiovascular and Mediastinum   Essential hypertension - Primary     Digestive   Esophageal reflux     Other   Morbid obesity     Plan: continue current meds as directed. Add stool softeners to regimen, also discussed dietary measures to help patient. Call back if persists. Explained importance of flu vaccine especially with her RA therapy, patient defers. Return in about 4 months (around 04/27/2014).

## 2014-04-18 DIAGNOSIS — Z029 Encounter for administrative examinations, unspecified: Secondary | ICD-10-CM

## 2014-04-25 ENCOUNTER — Ambulatory Visit: Payer: Medicaid Other | Admitting: Nurse Practitioner

## 2014-07-04 ENCOUNTER — Other Ambulatory Visit: Payer: Self-pay | Admitting: Family Medicine

## 2014-08-02 ENCOUNTER — Other Ambulatory Visit: Payer: Self-pay | Admitting: Family Medicine

## 2014-08-02 NOTE — Telephone Encounter (Signed)
Last seen 01/06/14.

## 2014-08-02 NOTE — Telephone Encounter (Signed)
Needs office visit.

## 2014-09-13 ENCOUNTER — Other Ambulatory Visit: Payer: Self-pay | Admitting: Family Medicine

## 2014-09-13 NOTE — Telephone Encounter (Signed)
Needs office visit.

## 2014-10-04 ENCOUNTER — Telehealth: Payer: Self-pay | Admitting: Cardiology

## 2014-10-04 NOTE — Telephone Encounter (Signed)
Due to insurance... Seeing a cardiologist in [place]

## 2016-02-06 IMAGING — CR DG CHEST 2V
2 series · 2 of 2 positions shown · non-contrast
Comparison: None.

CLINICAL DATA: Short of breath

EXAM:
CHEST  2 VIEW

[view not recorded (1 of 2)]
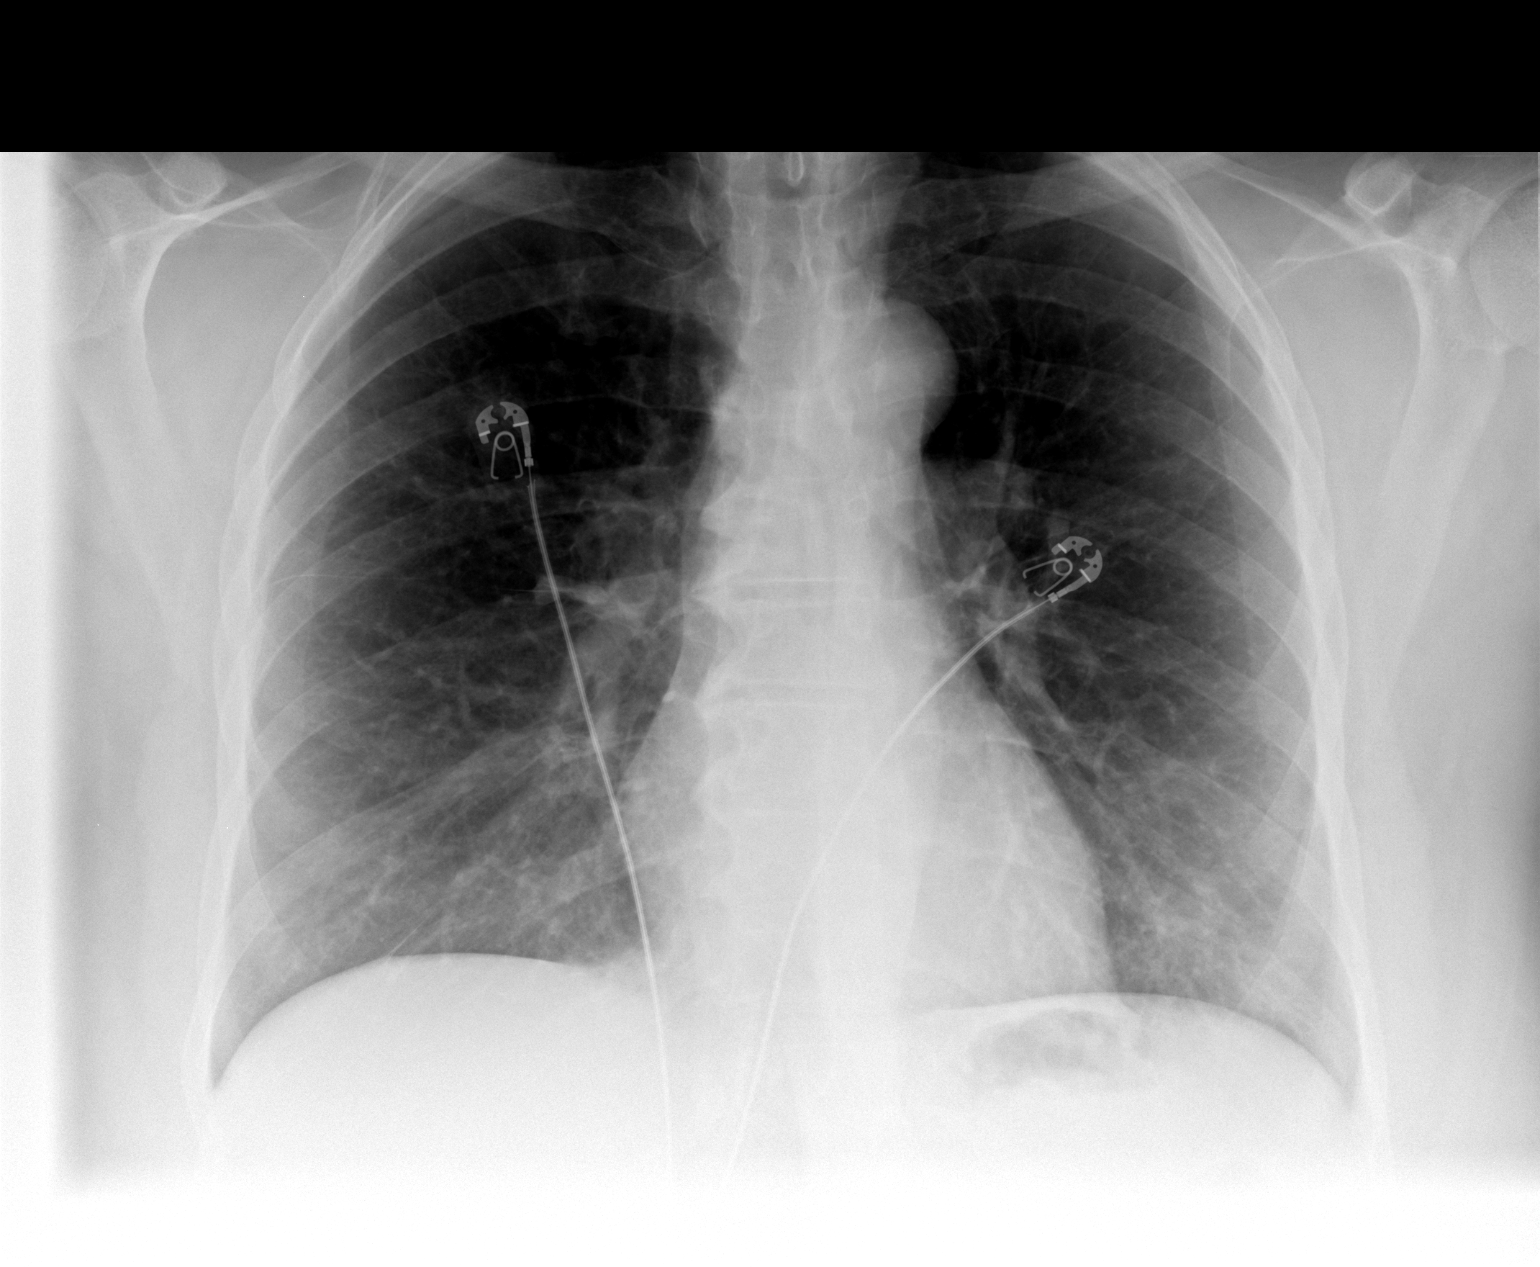

[view not recorded (2 of 2)]
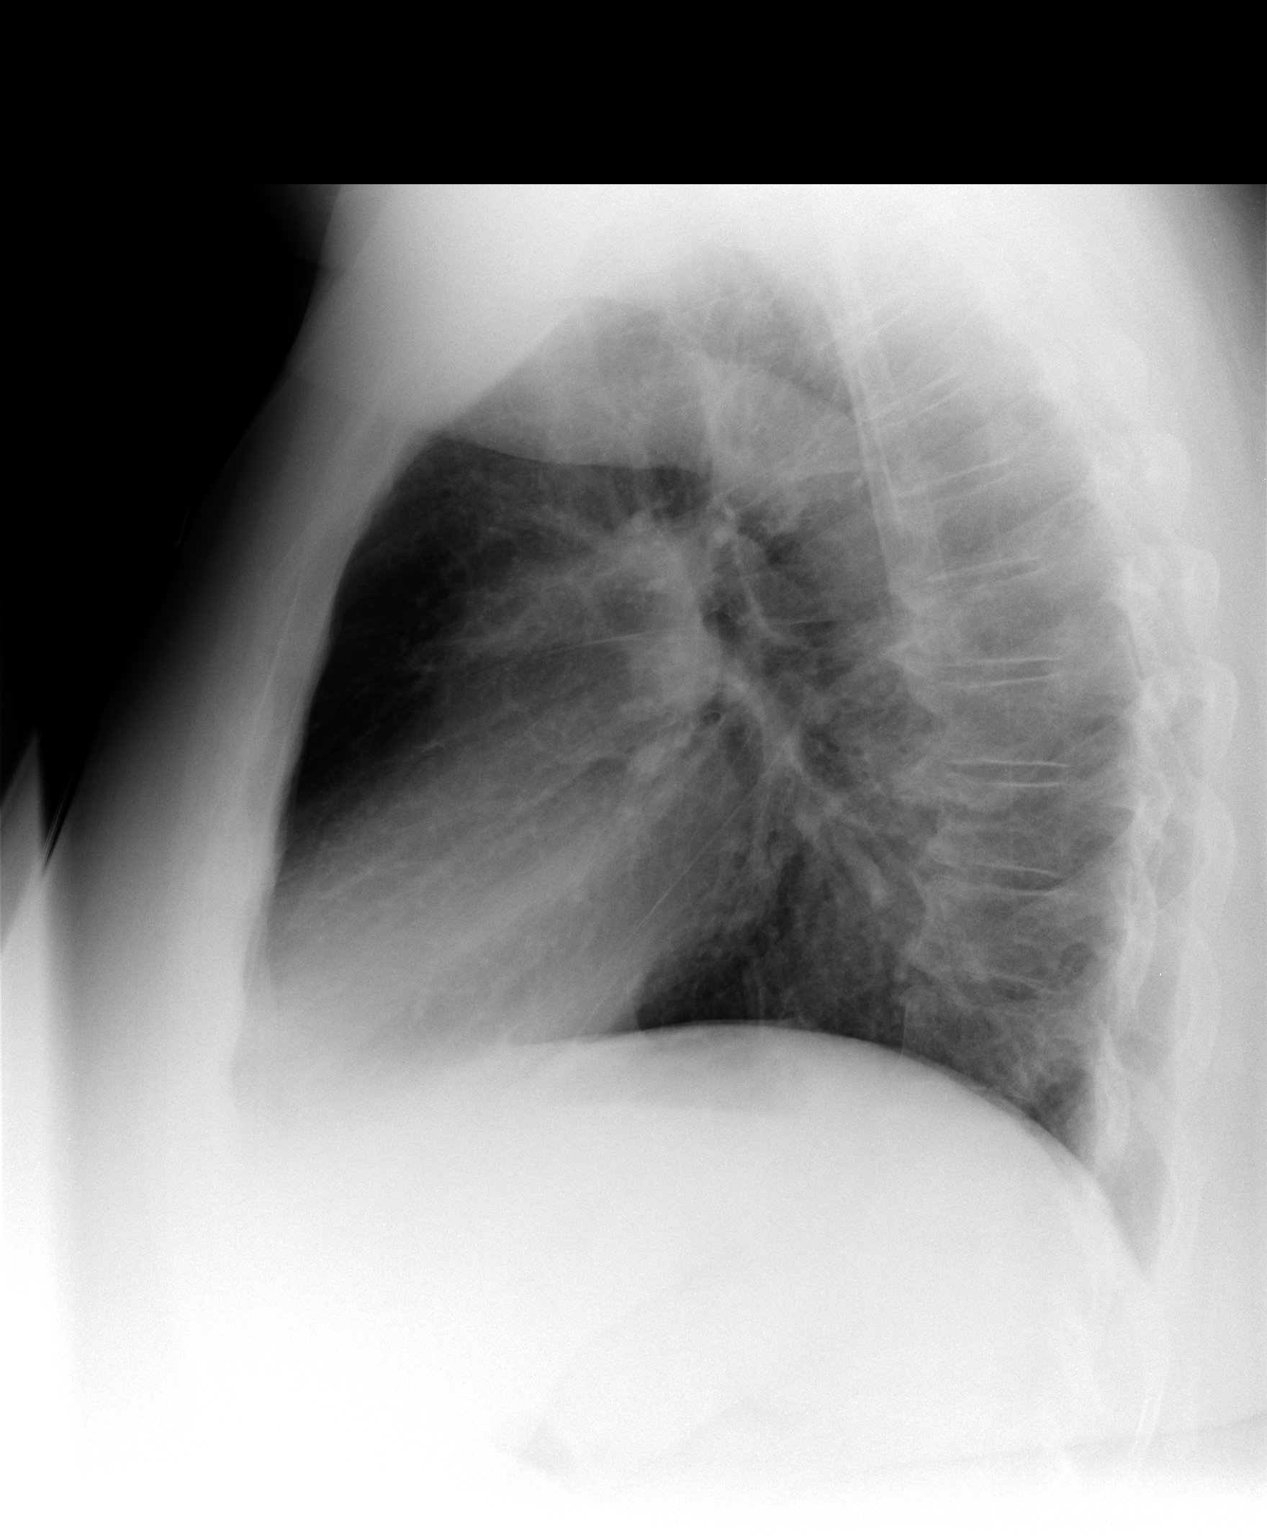

[2 of 2 positions shown; findings below may reference images not displayed]

FINDINGS: Normal mediastinum and cardiac silhouette. Normal pulmonary
vasculature. No evidence of effusion, infiltrate, or pneumothorax.
No acute bony abnormality. Degenerative osteophytosis of the
thoracic spine.
IMPRESSION: No acute cardiopulmonary process.

## 2019-03-31 ENCOUNTER — Encounter: Payer: Self-pay | Admitting: Family Medicine

## 2020-05-02 ENCOUNTER — Ambulatory Visit (HOSPITAL_COMMUNITY)
Admission: RE | Admit: 2020-05-02 | Discharge: 2020-05-02 | Disposition: A | Discharge status: Home / Self Care | Payer: Medicare Other | Source: Ambulatory Visit | Attending: Physician Assistant | Admitting: Physician Assistant

## 2020-05-02 ENCOUNTER — Other Ambulatory Visit: Payer: Self-pay

## 2020-05-02 ENCOUNTER — Other Ambulatory Visit (HOSPITAL_COMMUNITY): Payer: Self-pay | Admitting: Physician Assistant

## 2020-05-02 DIAGNOSIS — R0789 Other chest pain: Secondary | ICD-10-CM | POA: Insufficient documentation

## 2023-02-23 IMAGING — DX DG CHEST 2V
2 series · 2 of 2 positions shown · non-contrast
Comparison: None.

CLINICAL DATA: Chest pain

EXAM:
CHEST - 2 VIEW

[chest pa]
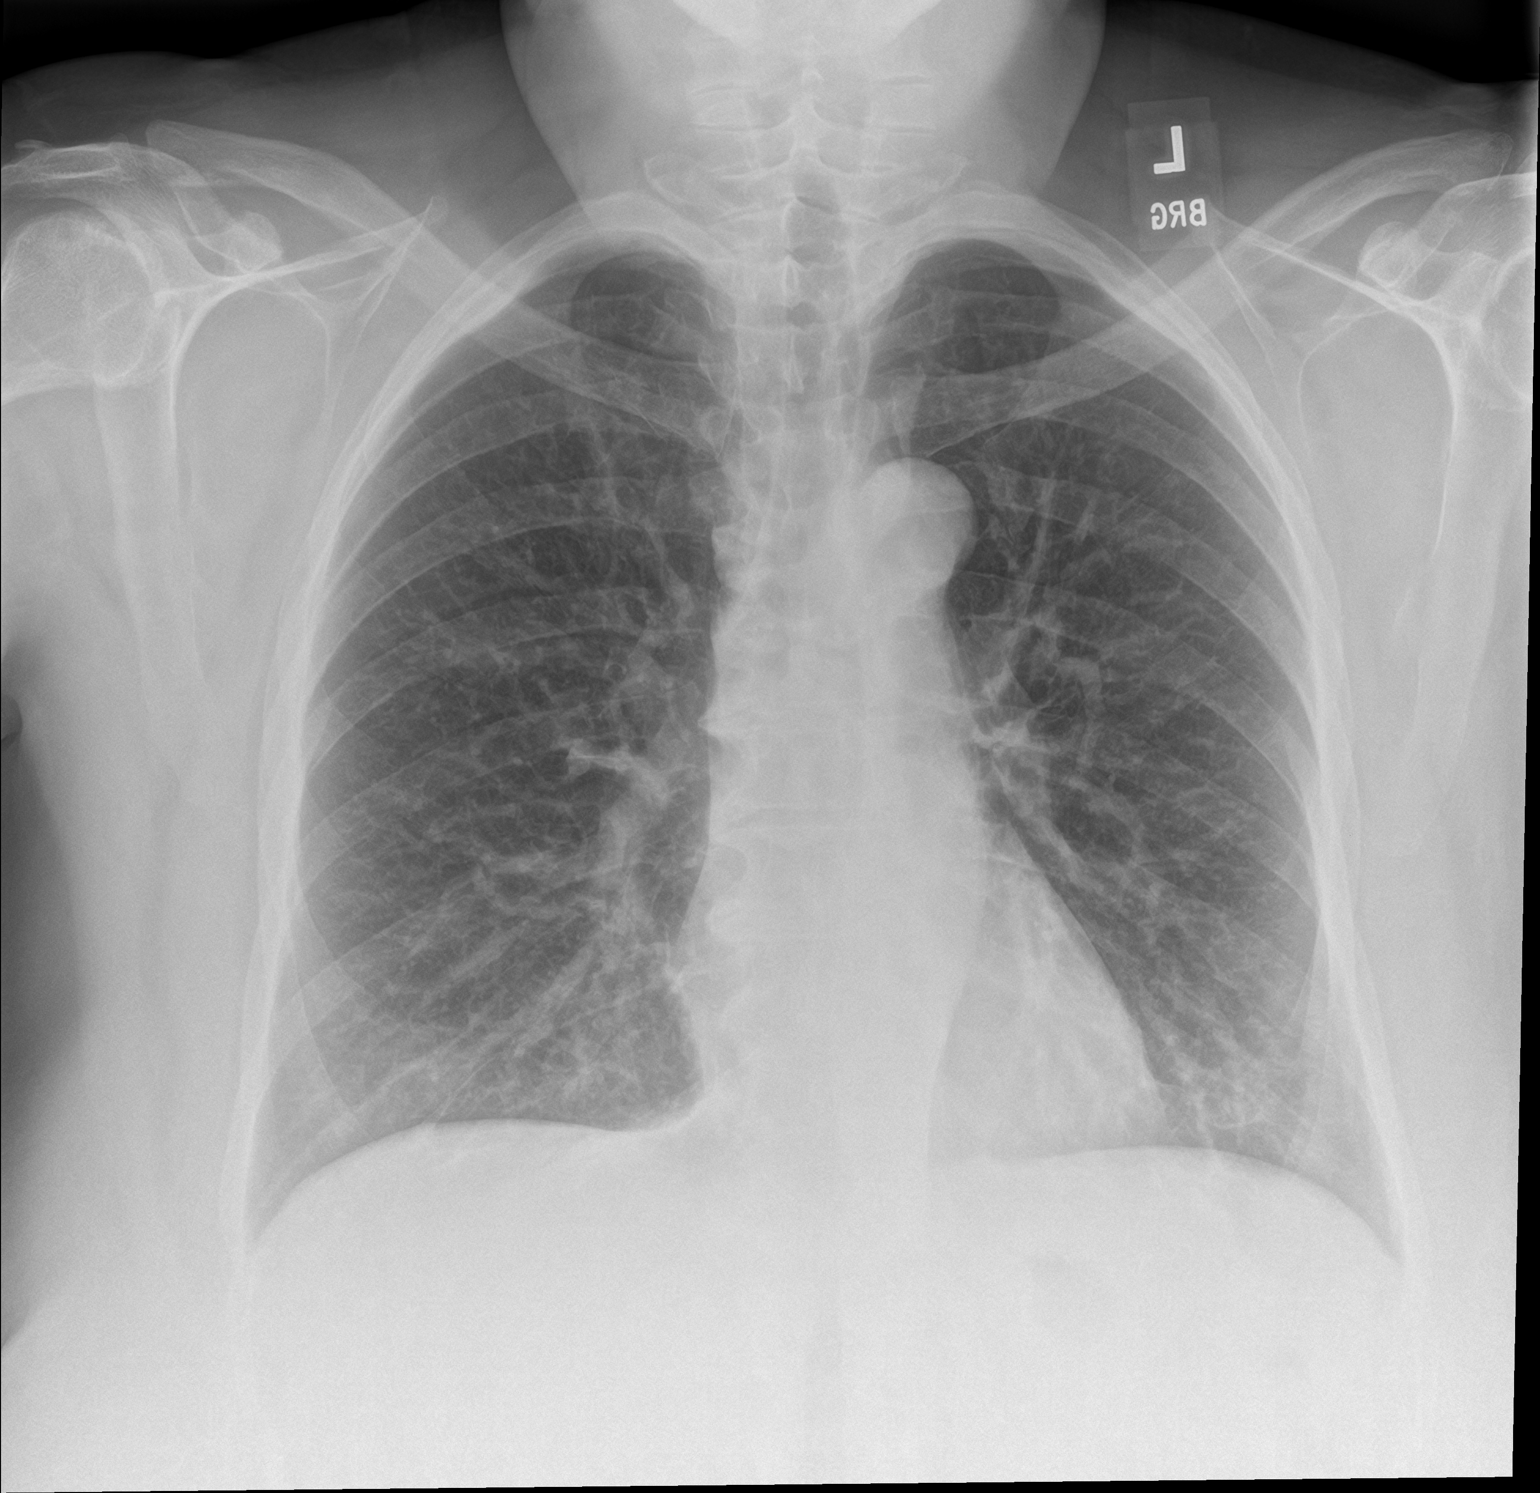

[chest lat]
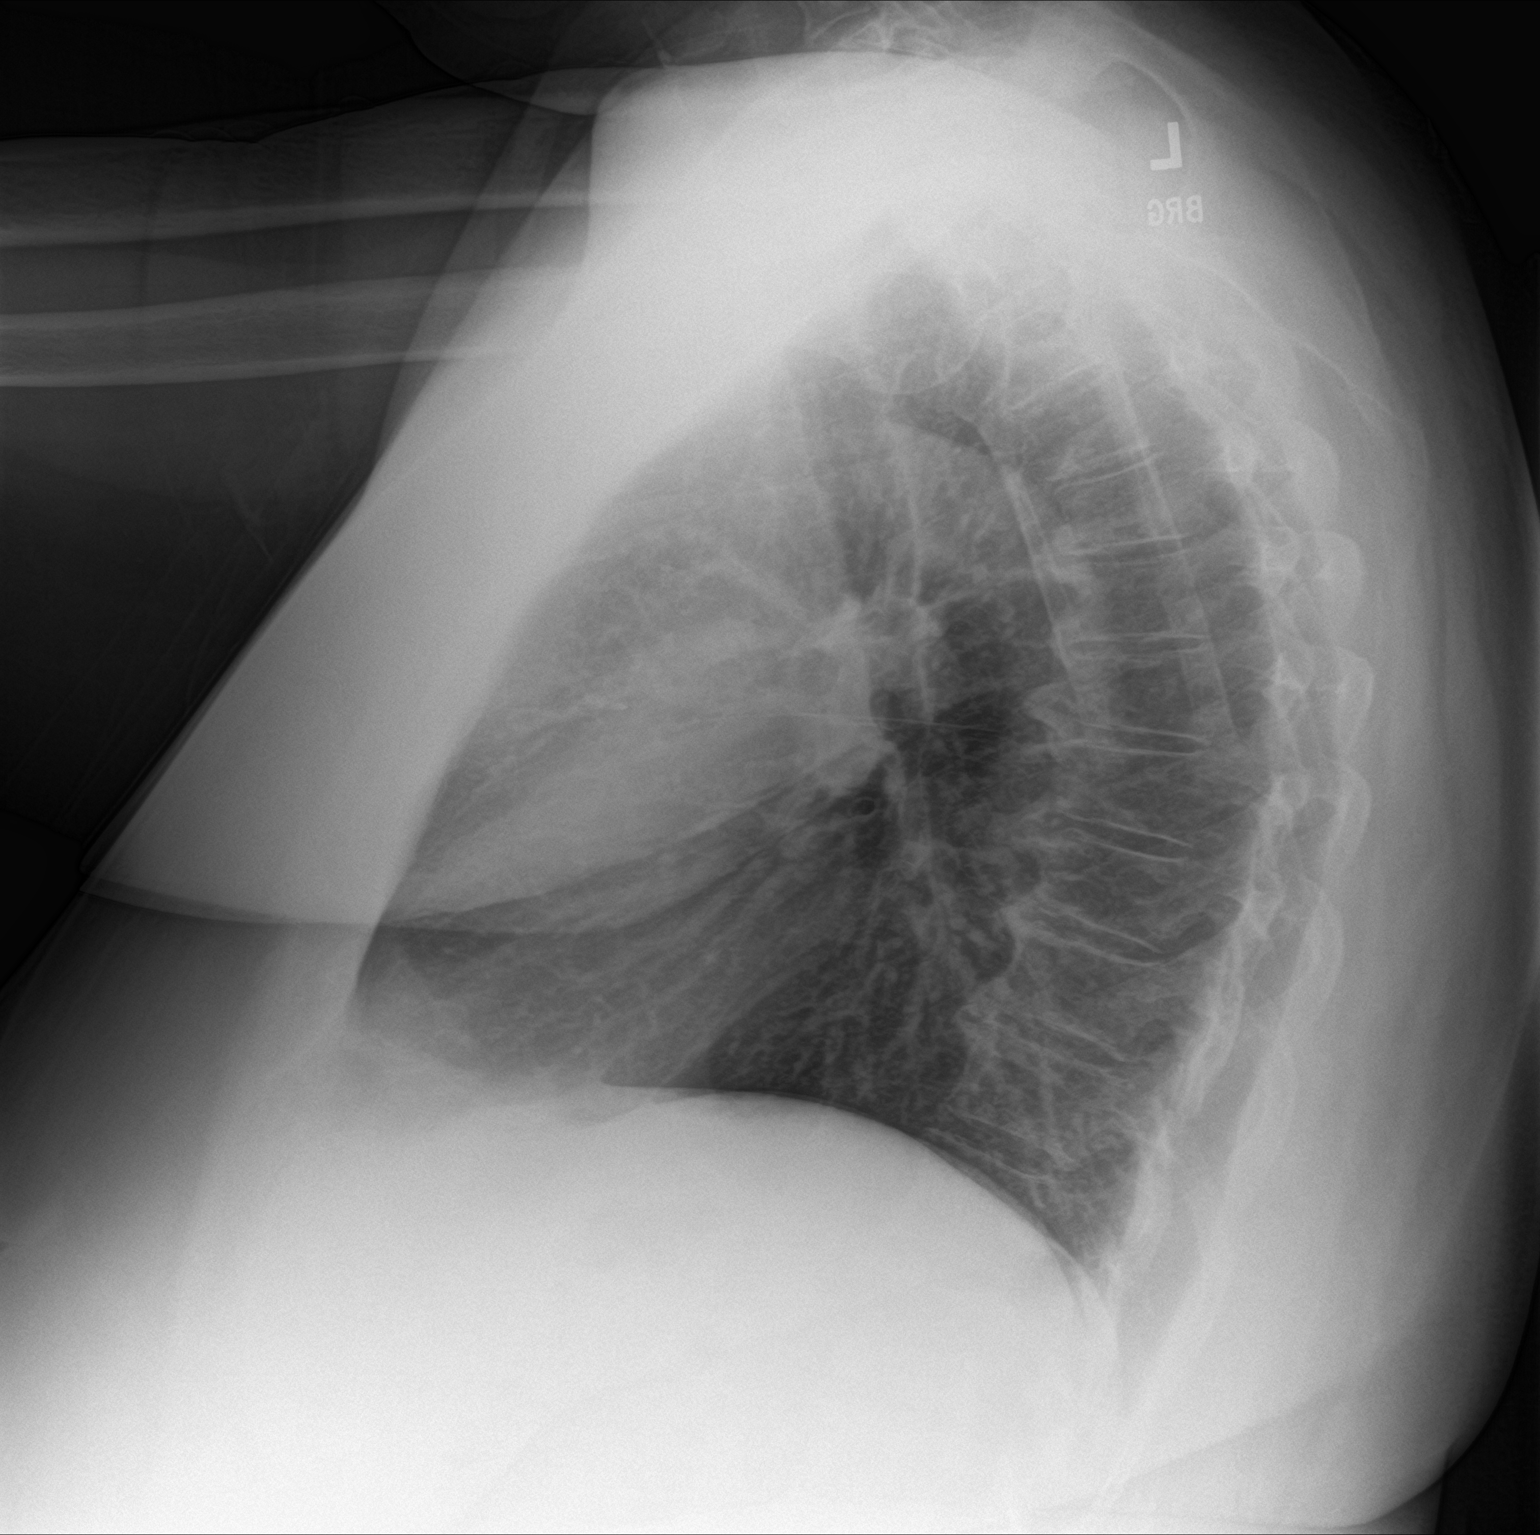

[2 of 2 positions shown; findings below may reference images not displayed]

FINDINGS: The lungs are symmetrically well expanded. No pneumothorax or
pleural effusion. Bronchitic changes are noted centrally. Minimal
atelectasis or infiltrate noted at the left lung base. Cardiac size
within normal limits. Pulmonary vascularity is normal. No acute bone
abnormality. Osseous structures are age-appropriate.
IMPRESSION: Bronchitic changes noted. Minimal left basilar atelectasis or
infiltrate.
